# Patient Record
Sex: Female | Born: 1995 | Race: White | Hispanic: No | Marital: Single | Smoking: Former smoker
Health system: Southern US, Community
[De-identification: ages and names within clinical notes are randomized; demographics above are authoritative.]

## PROBLEM LIST (undated history)

## (undated) DIAGNOSIS — R55 Syncope and collapse: Secondary | ICD-10-CM

## (undated) DIAGNOSIS — IMO0002 Reserved for concepts with insufficient information to code with codable children: Secondary | ICD-10-CM

## (undated) DIAGNOSIS — M329 Systemic lupus erythematosus, unspecified: Secondary | ICD-10-CM

## (undated) DIAGNOSIS — G8929 Other chronic pain: Secondary | ICD-10-CM

## (undated) DIAGNOSIS — G90A Postural orthostatic tachycardia syndrome (POTS): Secondary | ICD-10-CM

## (undated) DIAGNOSIS — M542 Cervicalgia: Secondary | ICD-10-CM

## (undated) DIAGNOSIS — R51 Headache: Secondary | ICD-10-CM

## (undated) DIAGNOSIS — R519 Headache, unspecified: Secondary | ICD-10-CM

## (undated) HISTORY — DX: Headache: R51

## (undated) HISTORY — DX: Headache, unspecified: R51.9

## (undated) HISTORY — PX: TONSILLECTOMY: SUR1361

## (undated) HISTORY — DX: Other chronic pain: G89.29

---
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
## 2014-01-04 ENCOUNTER — Ambulatory Visit (INDEPENDENT_AMBULATORY_CARE_PROVIDER_SITE_OTHER): Payer: Medicaid Other | Admitting: Family Medicine

## 2014-01-04 ENCOUNTER — Encounter: Payer: Self-pay | Admitting: Family Medicine

## 2014-01-04 VITALS — BP 112/69 | HR 89 | Temp 97.2°F | Ht 62.5 in | Wt 202.0 lb

## 2014-01-04 DIAGNOSIS — F329 Major depressive disorder, single episode, unspecified: Secondary | ICD-10-CM

## 2014-01-04 DIAGNOSIS — F32A Depression, unspecified: Secondary | ICD-10-CM

## 2014-01-04 DIAGNOSIS — J069 Acute upper respiratory infection, unspecified: Secondary | ICD-10-CM

## 2014-01-04 DIAGNOSIS — Z309 Encounter for contraceptive management, unspecified: Secondary | ICD-10-CM

## 2014-01-04 DIAGNOSIS — F3289 Other specified depressive episodes: Secondary | ICD-10-CM

## 2014-01-04 MED ORDER — AZITHROMYCIN 250 MG PO TABS: ORAL_TABLET | ORAL | Status: DC

## 2014-01-04 MED ORDER — BUPROPION HCL ER (XL) 150 MG PO TB24: 150.0000 mg | ORAL_TABLET | Freq: Every day | ORAL | Status: DC

## 2014-01-04 NOTE — Progress Notes (Signed)
   Subjective:    Patient ID: Shannon Galloway, female    DOB: 07/31/1996, 18 y.o.   MRN: [id number]  HPI This 18 y.o. female presents for evaluation of uri sx's and wants to discuss Contraception. She has a transdermal nexplanon insertion which she is not happy with because of her Weight gain.  She has been having problems with anger, depression, and panic sx's and she Was started on celexa and has been feeling better but she has put on weight and she is tired in The am.  She feels less motivated and tired since starting celexa.  She is c/o uri sx's.   Review of Systems C/o fatigue, weight gain, and URI sx's. No chest pain, SOB, HA, dizziness, vision change, N/V, diarrhea, constipation, dysuria, urinary urgency or frequency, myalgias, arthralgias or rash.     Objective:   Physical Exam  Vital signs noted  Well developed well nourished female.  HEENT - Head atraumatic Normocephalic                Eyes - PERRLA, Conjuctiva - clear Sclera- Clear EOMI                Ears - EAC's Wnl TM's Wnl Gross Hearing WNL                Nose - Nares patent                 Throat - oropharanx wnl Respiratory - Lungs CTA bilateral Cardiac - RRR S1 and S2 without murmur GI - Abdomen soft Nontender and bowel sounds active x 4 Extremities - No edema. Neuro - Grossly intact.      Assessment & Plan:  Contraception management - Plan: Ambulatory referral to Obstetrics / GYN.  She request to have implant for contraception removed and wants to go on BCP's.  Depression - Plan: buPROPion (WELLBUTRIN XL) 150 MG 24 hr tablet  DC celexa  And follow up in one month or sooner if having any se's.  She is provided a note for school Stating that rx medicine has made her tired etc.   URI (upper respiratory infection) - Plan: azithromycin (ZITHROMAX) 250 MG tablet. Push po fluids, rest, tylenol and motrin otc prn as directed for fever, arthralgias, and myalgias.  Follow up prn if sx's continue or  persist.  Deatra CanterWilliam J Deann Mclaine FNP

## 2014-01-04 NOTE — Patient Instructions (Signed)

## 2014-01-12 ENCOUNTER — Ambulatory Visit (INDEPENDENT_AMBULATORY_CARE_PROVIDER_SITE_OTHER): Payer: Medicaid Other | Admitting: Obstetrics & Gynecology

## 2014-01-12 ENCOUNTER — Encounter: Payer: Self-pay | Admitting: Obstetrics & Gynecology

## 2014-01-12 VITALS — BP 108/70 | HR 90 | Temp 98.1°F | Resp 20 | Wt 205.0 lb

## 2014-01-12 DIAGNOSIS — Z309 Encounter for contraceptive management, unspecified: Secondary | ICD-10-CM

## 2014-01-12 DIAGNOSIS — Z3009 Encounter for other general counseling and advice on contraception: Secondary | ICD-10-CM

## 2014-01-12 DIAGNOSIS — Z3046 Encounter for surveillance of implantable subdermal contraceptive: Secondary | ICD-10-CM

## 2014-01-12 MED ORDER — NORGESTIM-ETH ESTRAD TRIPHASIC 0.18/0.215/0.25 MG-25 MCG PO TABS
1.0000 | ORAL_TABLET | Freq: Every day | ORAL | Status: DC
Start: 2014-01-12 — End: 2014-03-08

## 2014-01-12 NOTE — Progress Notes (Signed)
Patient ID: Shannon Galloway, female   DOB: 02/03/1996, 18 y.o.   MRN: [id number] Requests removal of Nexplanon due to weight gain, will switch to OCP.  Consent obtained, 1% lidocaine 3 ml at site on left arm , betadine prp, small incision with 11 blade and Nexplanon removed intact and dressing applied. Rx Ortho Tricyclen lo   RTC PRN  Adam PhenixJames G [name], MD 01/12/2014

## 2014-01-12 NOTE — Progress Notes (Signed)
Pt desires removal of Nexplanon as she states she has gained 40 lbs since it was inserted in May 2014.

## 2014-01-12 NOTE — Patient Instructions (Signed)
Oral Contraception Information  Oral contraceptive pills (OCPs) are medicines taken to prevent pregnancy. OCPs work by preventing the ovaries from releasing eggs. The hormones in OCPs also cause the cervical mucus to thicken, preventing the sperm from entering the uterus. The hormones also cause the uterine lining to become thin, not allowing a fertilized egg to attach to the inside of the uterus. OCPs are highly effective when taken exactly as prescribed. However, OCPs do not prevent sexually transmitted diseases (STDs). Safe sex practices, such as using condoms along with the pill, can help prevent STDs.   Before taking the pill, you may have a physical exam and Pap test. Your health care provider may order blood tests. The health care provider will make sure you are a good candidate for oral contraception. Discuss with your health care provider the possible side effects of the OCP you may be prescribed. When starting an OCP, it can take 2 to 3 months for the body to adjust to the changes in hormone levels in your body.   TYPES OF ORAL CONTRACEPTION  · The combination pill This pill contains estrogen and progestin (synthetic progesterone) hormones. The combination pill comes in 21-day, 28-day, or 91-day packs. Some types of combination pills are meant to be taken continuously (365-day pills). With 21-day packs, you do not take pills for 7 days after the last pill. With 28-day packs, the pill is taken every day. The last 7 pills are without hormones. Certain types of pills have more than 21 hormone-containing pills. With 91-day packs, the first 84 pills contain both hormones, and the last 7 pills contain no hormones or contain estrogen only.  · The minipill This pill contains the progesterone hormone only. The pill is taken every day continuously. It is very important to take the pill at the same time each day. The minipill comes in packs of 28 pills. All 28 pills contain the hormone.    ADVANTAGES OF ORAL  CONTRACEPTIVE PILLS  · Decreases premenstrual symptoms.    · Treats menstrual period cramps.    · Regulates the menstrual cycle.    · Decreases a heavy menstrual flow.    · May treat acne, depending on the type of pill.    · Treats abnormal uterine bleeding.    · Treats polycystic ovarian syndrome.    · Treats endometriosis.    · Can be used as emergency contraception.    THINGS THAT CAN MAKE ORAL CONTRACEPTIVE PILLS LESS EFFECTIVE  OCPs can be less effective if:   · You forget to take the pill at the same time every day.    · You have a stomach or intestinal disease that lessens the absorption of the pill.    · You take OCPs with other medicines that make OCPs less effective, such as antibiotics, certain HIV medicines, and some seizure medicines.    · You take expired OCPs.    · You forget to restart the pill on day 7, when using the packs of 21 pills.    RISKS ASSOCIATED WITH ORAL CONTRACEPTIVE PILLS   Oral contraceptive pills can sometimes cause side effects, such as:  · Headache.  · Nausea.  · Breast tenderness.  · Irregular bleeding or spotting.  Combination pills are also associated with a small increased risk of:  · Blood clots.  · Heart attack.  · Stroke.  Document Released: 03/09/2003 Document Revised: 10/07/2013 Document Reviewed: 06/07/2013  [employer]® Patient Information ©2014 [employer].

## 2014-01-30 ENCOUNTER — Emergency Department (HOSPITAL_COMMUNITY)
Admission: EM | Admit: 2014-01-30 | Discharge: 2014-01-31 | Disposition: A | Discharge status: Home / Self Care | Payer: Medicaid Other | Attending: Emergency Medicine | Admitting: Emergency Medicine

## 2014-01-30 ENCOUNTER — Encounter (HOSPITAL_COMMUNITY): Payer: Self-pay | Admitting: Emergency Medicine

## 2014-01-30 DIAGNOSIS — R05 Cough: Secondary | ICD-10-CM | POA: Insufficient documentation

## 2014-01-30 DIAGNOSIS — IMO0001 Reserved for inherently not codable concepts without codable children: Secondary | ICD-10-CM | POA: Insufficient documentation

## 2014-01-30 DIAGNOSIS — R109 Unspecified abdominal pain: Secondary | ICD-10-CM | POA: Insufficient documentation

## 2014-01-30 DIAGNOSIS — Z79899 Other long term (current) drug therapy: Secondary | ICD-10-CM | POA: Insufficient documentation

## 2014-01-30 DIAGNOSIS — R059 Cough, unspecified: Secondary | ICD-10-CM | POA: Insufficient documentation

## 2014-01-30 DIAGNOSIS — Z3202 Encounter for pregnancy test, result negative: Secondary | ICD-10-CM | POA: Insufficient documentation

## 2014-01-30 DIAGNOSIS — M791 Myalgia, unspecified site: Secondary | ICD-10-CM

## 2014-01-30 MED ORDER — IBUPROFEN 800 MG PO TABS
800.0000 mg | ORAL_TABLET | Freq: Once | ORAL | Status: AC
  Administered 2014-01-31: 800 mg via ORAL
  Filled 2014-01-30: qty 1

## 2014-01-30 NOTE — ED Provider Notes (Signed)
CSN: [id number]     Arrival date & time 01/30/14  2250 History  This chart was scribed for Joya Gaskinsonald W Lanora Reveron, MD by Joaquin MusicKristina Sanchez-Matthews, ED Scribe. This patient was seen in room APA07/APA07 and the patient's care was started at  11:41 PM  Chief Complaint  Patient presents with  . Generalized Body Aches   The history is provided by the patient. No language interpreter was used.  HPI Comments: Shannon Galloway is a 18 y.o. female who presents to the Emergency Department complaining of ongoing generalized body aches with bilateral upper extremity weakness, intermittent cough, abd and R flank pain that began this morning. Pt states she woke up this morning feeling "like she was beat up by 2 people". Pt states she has been having darker stools than normal but denies hematochezia. Pt denies taking OTC medications. Pt denies hx of generalized body aches. Pt denies hx of DM and states she is otherwise healthy. Pt denies fever, chills, n/v/d/ sore throat, dysuria, frequency, hematochezia, and vaginal bleeding or discharge.   Pt states her PCP plans to screen her thyroid due to having a hx of anxiety and facial numbness associated. Pt states she has gained over 60+ lbs since May 2014. She states she has "bad lower extremity swelling but believes this is due to working long hours at her [employer].   Past Medical History  Diagnosis Date  . Chronic headaches   . Neck pain    Past Surgical History  Procedure Laterality Date  . Knee surgery Right 2011  . Tonsillectomy Bilateral    Family History  Problem Relation Age of Onset  . Adopted: Yes  . Heart failure Father     Died at 6041  . Bipolar disorder Mother   . Pneumonia Mother     Died at 2739   History  Substance Use Topics  . Smoking status: Passive Smoke Exposure - Never Smoker  . Smokeless tobacco: Never Used  . Alcohol Use: No   OB History   Grav Para Term Preterm Abortions TAB SAB Ect Mult Living   0 0 0 0 0 0 0 0 0 0       Review of Systems  Constitutional: Negative for fever.  HENT: Negative for sore throat.   Respiratory: Positive for cough.   Gastrointestinal: Negative for nausea, vomiting, diarrhea and blood in stool.  Genitourinary: Negative for dysuria, urgency and vaginal bleeding.  Neurological: Positive for weakness (upper extremities).  All other systems reviewed and are negative.    Allergies  Review of patient's allergies indicates no known allergies.  Home Medications   Current Outpatient Rx  Name  Route  Sig  Dispense  Refill  . azithromycin (ZITHROMAX) 250 MG tablet      Take 2 po today and one po qd x 4 days   6 tablet   1   . buPROPion (WELLBUTRIN XL) 150 MG 24 hr tablet   Oral   Take 1 tablet (150 mg total) by mouth daily.   30 tablet   11   . etonogestrel (IMPLANON) 68 MG IMPL implant   Subcutaneous   Inject 1 each into the skin once.         . Norgestimate-Ethinyl Estradiol Triphasic (ORTHO TRI-CYCLEN LO) 0.18/0.215/0.25 MG-25 MCG tab   Oral   Take 1 tablet by mouth daily.   1 Package   11    Triage Vitals:BP 140/70  Pulse 98  Temp(Src) 98.9 F (37.2 C) (Oral)  Resp  18  Ht 5\' 2"  (1.575 m)  Wt 195 lb (88.451 kg)  BMI 35.66 kg/m2  SpO2 100%  LMP 12/28/2013  Physical Exam CONSTITUTIONAL: Well developed/well nourished, no distress HEAD: Normocephalic/atraumatic EYES: EOMI/PERRL ENMT: Mucous membranes moist NECK: supple no meningeal signs. No thyromegaly. SPINE:entire spine nontender CV: S1/S2 noted, no murmurs/rubs/gallops noted LUNGS: Lungs are clear to auscultation bilaterally, no apparent distress ABDOMEN: soft, nontender, no rebound or guarding GU:no cva tenderness NEURO: Pt is awake/alert, moves all extremitiesx4, gait normal without ataxia.  No arm drift.  No focal weakness noted to any of her extremities.   EXTREMITIES: pulses normal, full ROM. No edema or discoloration to lower extremities. SKIN: warm, color normal PSYCH: no  abnormalities of mood noted  ED Course  Procedures  DIAGNOSTIC STUDIES: Oxygen Saturation is 100% on RA, normal by my interpretation.    COORDINATION OF CARE: 11:45 PM-Discussed treatment plan which includes labs. Pt agreed to plan.   Pt here for multiple complaints She mentions abd pain but no focal tenderness noted She reported weakness but no focal weakness noted in any of her extremities She also reports myalgias, but her exam is unremarkable Advised need for PCP followup Advised need for recheck of her BP by PCP  Labs Review Labs Reviewed - No data to display Imaging Review No results found.  EKG Interpretation   None       MDM  No diagnosis found. Nursing notes including past medical history and social history reviewed and considered in documentation Labs/vital reviewed and considered   I personally performed the services described in this documentation, which was scribed in my presence. The recorded information has been reviewed and is accurate.      Joya Gaskins, MD 01/31/14 [phone]

## 2014-01-30 NOTE — ED Notes (Signed)
Pt reporting vague generalized body aches.  States pain is mostly in neck and knees.  Denies nausea or vomiting.  Reporting that she had dark stools about 2 weeks ago.  Pt also reporting that her doctor wanted to have her thyroid checked several months ago and would like to have that test completed.

## 2014-01-31 LAB — URINALYSIS, ROUTINE W REFLEX MICROSCOPIC
Bilirubin Urine: NEGATIVE
GLUCOSE, UA: NEGATIVE mg/dL
HGB URINE DIPSTICK: NEGATIVE
Ketones, ur: NEGATIVE mg/dL
LEUKOCYTES UA: NEGATIVE
Nitrite: NEGATIVE
PH: 6.5 (ref 5.0–8.0)
PROTEIN: NEGATIVE mg/dL
Specific Gravity, Urine: 1.02 (ref 1.005–1.030)
Urobilinogen, UA: 0.2 mg/dL (ref 0.0–1.0)

## 2014-02-01 LAB — POCT PREGNANCY, URINE
Preg Test, Ur: NEGATIVE
Preg Test, Ur: NEGATIVE

## 2014-03-08 ENCOUNTER — Encounter (HOSPITAL_COMMUNITY): Payer: Self-pay | Admitting: Emergency Medicine

## 2014-03-08 ENCOUNTER — Emergency Department (HOSPITAL_COMMUNITY)
Admission: EM | Admit: 2014-03-08 | Discharge: 2014-03-08 | Disposition: A | Discharge status: Home / Self Care | Payer: Medicaid Other | Attending: Emergency Medicine | Admitting: Emergency Medicine

## 2014-03-08 DIAGNOSIS — M25476 Effusion, unspecified foot: Secondary | ICD-10-CM | POA: Insufficient documentation

## 2014-03-08 DIAGNOSIS — Z9889 Other specified postprocedural states: Secondary | ICD-10-CM | POA: Insufficient documentation

## 2014-03-08 DIAGNOSIS — G8929 Other chronic pain: Secondary | ICD-10-CM | POA: Insufficient documentation

## 2014-03-08 DIAGNOSIS — R635 Abnormal weight gain: Secondary | ICD-10-CM | POA: Insufficient documentation

## 2014-03-08 DIAGNOSIS — R000 Tachycardia, unspecified: Secondary | ICD-10-CM | POA: Insufficient documentation

## 2014-03-08 DIAGNOSIS — R609 Edema, unspecified: Secondary | ICD-10-CM | POA: Insufficient documentation

## 2014-03-08 DIAGNOSIS — M25473 Effusion, unspecified ankle: Secondary | ICD-10-CM | POA: Insufficient documentation

## 2014-03-08 DIAGNOSIS — Z3202 Encounter for pregnancy test, result negative: Secondary | ICD-10-CM | POA: Insufficient documentation

## 2014-03-08 LAB — CBC WITH DIFFERENTIAL/PLATELET
Basophils Absolute: 0 10*3/uL (ref 0.0–0.1)
Basophils Relative: 0 % (ref 0–1)
Eosinophils Absolute: 0.1 10*3/uL (ref 0.0–0.7)
Eosinophils Relative: 1 % (ref 0–5)
HCT: 34.4 % — ABNORMAL LOW (ref 36.0–46.0)
Hemoglobin: 11.8 g/dL — ABNORMAL LOW (ref 12.0–15.0)
Lymphocytes Relative: 24 % (ref 12–46)
Lymphs Abs: 2.3 10*3/uL (ref 0.7–4.0)
MCH: 28.9 pg (ref 26.0–34.0)
MCHC: 34.3 g/dL (ref 30.0–36.0)
MCV: 84.3 fL (ref 78.0–100.0)
Monocytes Absolute: 0.5 10*3/uL (ref 0.1–1.0)
Monocytes Relative: 6 % (ref 3–12)
Neutro Abs: 6.4 10*3/uL (ref 1.7–7.7)
Neutrophils Relative %: 68 % (ref 43–77)
Platelets: 276 10*3/uL (ref 150–400)
RBC: 4.08 MIL/uL (ref 3.87–5.11)
RDW: 13.1 % (ref 11.5–15.5)
WBC: 9.3 10*3/uL (ref 4.0–10.5)

## 2014-03-08 LAB — URINALYSIS, ROUTINE W REFLEX MICROSCOPIC
Bilirubin Urine: NEGATIVE
Glucose, UA: NEGATIVE mg/dL
Hgb urine dipstick: NEGATIVE
Ketones, ur: NEGATIVE mg/dL
Leukocytes, UA: NEGATIVE
Nitrite: NEGATIVE
Protein, ur: NEGATIVE mg/dL
Specific Gravity, Urine: 1.015 (ref 1.005–1.030)
Urobilinogen, UA: 0.2 mg/dL (ref 0.0–1.0)
pH: 7 (ref 5.0–8.0)

## 2014-03-08 LAB — COMPREHENSIVE METABOLIC PANEL
ALT: 32 U/L (ref 0–35)
AST: 20 U/L (ref 0–37)
Albumin: 3.7 g/dL (ref 3.5–5.2)
Alkaline Phosphatase: 59 U/L (ref 39–117)
BUN: 10 mg/dL (ref 6–23)
CO2: 25 mEq/L (ref 19–32)
Calcium: 8.8 mg/dL (ref 8.4–10.5)
Chloride: 106 mEq/L (ref 96–112)
Creatinine, Ser: 0.59 mg/dL (ref 0.50–1.10)
GFR calc Af Amer: >90 mL/min (ref >90)
GFR calc non Af Amer: >90 mL/min (ref >90)
Glucose, Bld: 93 mg/dL (ref 70–99)
Potassium: 3.6 mEq/L — ABNORMAL LOW (ref 3.7–5.3)
Sodium: 141 mEq/L (ref 137–147)
Total Bilirubin: <0.2 mg/dL — ABNORMAL LOW (ref 0.3–1.2)
Total Protein: 7.6 g/dL (ref 6.0–8.3)

## 2014-03-08 LAB — PREGNANCY, URINE: Preg Test, Ur: NEGATIVE

## 2014-03-08 MED ORDER — FUROSEMIDE 20 MG PO TABS: 20.0000 mg | ORAL_TABLET | Freq: Two times a day (BID) | ORAL | Status: DC

## 2014-03-08 MED ORDER — FUROSEMIDE 40 MG PO TABS
20.0000 mg | ORAL_TABLET | Freq: Once | ORAL | Status: AC
  Administered 2014-03-08: 20 mg via ORAL
  Filled 2014-03-08: qty 1

## 2014-03-08 MED ORDER — POTASSIUM CHLORIDE ER 10 MEQ PO TBCR: 10.0000 meq | EXTENDED_RELEASE_TABLET | Freq: Two times a day (BID) | ORAL | Status: DC

## 2014-03-08 MED ORDER — POTASSIUM CHLORIDE CRYS ER 20 MEQ PO TBCR
40.0000 meq | EXTENDED_RELEASE_TABLET | Freq: Once | ORAL | Status: AC
Start: 2014-03-08 — End: 2014-03-08
  Administered 2014-03-08: 40 meq via ORAL
  Filled 2014-03-08: qty 2

## 2014-03-08 NOTE — ED Provider Notes (Signed)
CSN: [id number]     Arrival date & time 03/08/14  1545 History   This chart was scribed for Raeford Razor, MD by Ladona Ridgel Day, ED scribe. This patient was seen in room APA12/APA12 and the patient's care was started at 1545.  Chief Complaint  Patient presents with  . Shortness of Breath   The history is provided by the patient. No language interpreter was used.   HPI Comments: Shannon Galloway is a 18 y.o. female who presents to the Emergency Department complaining of waxing and waning bilateral foot and ankle swelling, ongoing for x1 month and was directed to come to ED for this problem after she was seen by her PCP today and would like her to have blood work as well thyroid level. She reports swelling not as bad now as it has been, at one point "it was so bad the skin over my feet turned purple" and statse taking a hot shower makes swelling worse. She is also concerned for family hx of DM b/c she has been excessively thirsty and voiding frequently. She also describes periodic swelling of her hands but not as bad as swelling in her feet/ankles. She also reports that has unintentionally gained 70 lb over the past year. She reports SOB that worsens w/exertion, does not worsen w/laying flat while sleeping. She denies abdominal pain or diarrhea.   Past Medical History  Diagnosis Date  . Chronic headaches   . Neck pain    Past Surgical History  Procedure Laterality Date  . Knee surgery Right 2011  . Tonsillectomy Bilateral    Family History  Problem Relation Age of Onset  . Adopted: Yes  . Heart failure Father     Died at 55  . Bipolar disorder Mother   . Pneumonia Mother     Died at 57   History  Substance Use Topics  . Smoking status: Passive Smoke Exposure - Never Smoker  . Smokeless tobacco: Never Used  . Alcohol Use: No   OB History   Grav Para Term Preterm Abortions TAB SAB Ect Mult Living   0 0 0 0 0 0 0 0 0 0      Review of Systems  Constitutional: Negative for fever and  chills.  Respiratory: Positive for shortness of breath. Negative for cough.   Cardiovascular: Positive for leg swelling. Negative for chest pain.  Gastrointestinal: Negative for nausea, vomiting, abdominal pain and diarrhea.  Musculoskeletal: Negative for back pain.  All other systems reviewed and are negative.   Allergies  Review of patient's allergies indicates no known allergies.  Home Medications   Current Outpatient Rx  Name  Route  Sig  Dispense  Refill  . naproxen sodium (ANAPROX) 550 MG tablet   Oral   Take 550 mg by mouth daily as needed for mild pain or moderate pain.          Triage Vitals: BP 142/66  Pulse 107  Temp(Src) 98 F (36.7 C) (Oral)  Resp 18  Ht 5\' 2"  (1.575 m)  Wt 224 lb 9 oz (101.861 kg)  BMI 41.06 kg/m2  SpO2 98%  LMP 01/08/2014  Physical Exam  Nursing note and vitals reviewed. Constitutional: She is oriented to person, place, and time. She appears well-developed and well-nourished. No distress.  HENT:  Head: Normocephalic and atraumatic.  Eyes: Conjunctivae are normal. Right eye exhibits no discharge. Left eye exhibits no discharge.  Neck: Normal range of motion. Neck supple.  Cardiovascular: Normal rate, regular rhythm and normal  heart sounds.  Exam reveals no gallop and no friction rub.   No murmur heard. Mildly tachycardic  Pulmonary/Chest: Effort normal and breath sounds normal. No respiratory distress. She has no wheezes. She has no rales.  Abdominal: Soft. She exhibits no distension. There is no tenderness.  Musculoskeletal: Normal range of motion. She exhibits edema. She exhibits no tenderness.  Severe, symmetric, non pitting lower edema Negative homan's, no calf tenderness  Neurological: She is alert and oriented to person, place, and time.  Skin: Skin is warm and dry.  Psychiatric: She has a normal mood and affect. Her behavior is normal. Thought content normal.   ED Course  Procedures (including critical care time) DIAGNOSTIC  STUDIES: Oxygen Saturation is 98% on room air, normal by my interpretation.    COORDINATION OF CARE: At 415 PM Discussed treatment plan with patient which includes TSH, T4 free, blood work, UA, pregnancy UA, EKG. Patient agrees.   Labs Review Labs Reviewed  COMPREHENSIVE METABOLIC PANEL - Abnormal; Notable for the following:    Potassium 3.6 (*)    Total Bilirubin <0.2 (*)    All other components within normal limits  CBC WITH DIFFERENTIAL - Abnormal; Notable for the following:    Hemoglobin 11.8 (*)    HCT 34.4 (*)    All other components within normal limits  URINALYSIS, ROUTINE W REFLEX MICROSCOPIC - Abnormal; Notable for the following:    APPearance HAZY (*)    All other components within normal limits  PREGNANCY, URINE  TSH  T4, FREE   Imaging Review No results found.   EKG Interpretation   Date/Time:  Monday March 08 2014 16:31:33 EDT Ventricular Rate:  99 PR Interval:  120 QRS Duration: 98 QT Interval:  348 QTC Calculation: 446 R Axis:   30 Text Interpretation:  Normal sinus rhythm Normal ECG No previous ECGs  available Confirmed by Dannilynn Gallina  MD, Ellaina Schuler ([id number]) on 03/08/2014 6:13:13 PM      MDM   Final diagnoses:  Peripheral edema    17yF with peripheral edema. Per review of records, ~60 lbs weight gain in less than a year. W/u pretty unremarkable. No increased WOB, hypoxemia or adventitious breath sounds. Renal function normal. LFTs normal. Mild tachycardia but noted on numerous previous visits as well. Pt understands thyroid studies need followed-up. Short course lasix. Return precautions discussed.    I personally performed the services described in this documentation, which was scribed in my presence. The recorded information has been reviewed and is accurate.        Raeford RazorStephen Rafeef Lau, MD 03/08/14 1816

## 2014-03-08 NOTE — ED Notes (Signed)
Patient felt light headed after walking to the restroom and back to the room. Checked vital signs and they were within normal limits. RN made aware.

## 2014-03-08 NOTE — ED Notes (Addendum)
Pt states SOB and swelling to feet x 1 mo. Pt states she keeps gaining weight. [facility] Pt went by office but was unable to be seen today. Pt [facility].

## 2014-03-08 NOTE — Discharge Instructions (Signed)

## 2014-03-09 ENCOUNTER — Telehealth: Payer: Self-pay | Admitting: Family Medicine

## 2014-03-09 ENCOUNTER — Ambulatory Visit: Payer: Medicaid Other | Admitting: Family Medicine

## 2014-03-09 LAB — TSH: TSH: 3.39 u[IU]/mL (ref 0.350–4.500)

## 2014-03-09 LAB — T4, FREE: Free T4: 0.86 ng/dL (ref 0.80–1.80)

## 2014-03-10 NOTE — Telephone Encounter (Signed)
appt made

## 2014-03-11 ENCOUNTER — Encounter: Payer: Self-pay | Admitting: *Deleted

## 2014-03-11 ENCOUNTER — Ambulatory Visit (INDEPENDENT_AMBULATORY_CARE_PROVIDER_SITE_OTHER): Payer: Medicaid Other

## 2014-03-11 ENCOUNTER — Ambulatory Visit (INDEPENDENT_AMBULATORY_CARE_PROVIDER_SITE_OTHER): Payer: Medicaid Other | Admitting: Nurse Practitioner

## 2014-03-11 VITALS — BP 130/75 | HR 103 | Temp 99.1°F | Ht 62.0 in | Wt 218.4 lb

## 2014-03-11 DIAGNOSIS — M545 Low back pain, unspecified: Secondary | ICD-10-CM

## 2014-03-11 DIAGNOSIS — N912 Amenorrhea, unspecified: Secondary | ICD-10-CM

## 2014-03-11 DIAGNOSIS — R55 Syncope and collapse: Secondary | ICD-10-CM

## 2014-03-11 LAB — POCT URINE PREGNANCY: Preg Test, Ur: NEGATIVE

## 2014-03-11 LAB — POCT URINALYSIS DIPSTICK
BILIRUBIN UA: NEGATIVE
GLUCOSE UA: NEGATIVE
Ketones, UA: NEGATIVE
Leukocytes, UA: NEGATIVE
Nitrite, UA: NEGATIVE
Protein, UA: NEGATIVE
RBC UA: NEGATIVE
UROBILINOGEN UA: NEGATIVE
pH, UA: 5

## 2014-03-11 LAB — POCT UA - MICROSCOPIC ONLY
Bacteria, U Microscopic: NEGATIVE
CASTS, UR, LPF, POC: NEGATIVE
Crystals, Ur, HPF, POC: NEGATIVE
MUCUS UA: NEGATIVE
RBC, urine, microscopic: NEGATIVE
WBC, Ur, HPF, POC: NEGATIVE

## 2014-03-11 MED ORDER — CYCLOBENZAPRINE HCL 5 MG PO TABS: 5.0000 mg | ORAL_TABLET | Freq: Three times a day (TID) | ORAL | Status: DC | PRN

## 2014-03-11 MED ORDER — IBUPROFEN 800 MG PO TABS: 800.0000 mg | ORAL_TABLET | Freq: Three times a day (TID) | ORAL | Status: DC | PRN

## 2014-03-11 NOTE — Progress Notes (Signed)
   Subjective:    Patient ID: Shannon Galloway, female    DOB: 11/17/1996, 18 y.o.   MRN: [id number]  HPI  Patient in today with 2 compliants: 1- Passed out last night- she was yelling at her dogs standing and the next thing she knew she was on the floor- Was by herself so has no idea how long she was out- Ems came out and they said she needed to be checked out. Has headache but says that she always has headaches. Denies hitting her head when she fell. 2- she is also c/o lower back pain- started 1hour ago - was sitting in car when it started. Pain is in the middle of her back .sitting straight in a chair relieves some pain.   Review of Systems  Constitutional: Negative.   Eyes: Negative.   Respiratory: Negative.   Cardiovascular: Negative.   Genitourinary: Negative.   Neurological: Positive for syncope and headaches. Negative for dizziness, seizures, light-headedness and numbness.  All other systems reviewed and are negative.       Objective:   Physical Exam  Constitutional: She is oriented to person, place, and time. She appears well-developed and well-nourished.  Cardiovascular: Normal rate, regular rhythm and normal heart sounds.   Pulmonary/Chest: Effort normal and breath sounds normal.  Musculoskeletal:  FROM of lumbar spine with pain on flexion and rotation to left (-) SLR bil Motor strength and sensation distally intact  Neurological: She is alert and oriented to person, place, and time. She has normal reflexes. No cranial nerve deficit.  Skin: Skin is warm and dry.  Psychiatric: She has a normal mood and affect. Her behavior is normal. Judgment and thought content normal.   BP 130/75  Pulse 103  Temp(Src) 99.1 F (37.3 C) (Oral)  Ht 5\' 2"  (1.575 m)  Wt 218 lb 6.4 oz (99.066 kg)  BMI 39.94 kg/m2  LMP 12/11/2013        Assessment & Plan:   1. Low back pain   2. Amenorrhea   3. Syncope, unspecified    Meds ordered this encounter  Medications  . ibuprofen  (ADVIL,MOTRIN) 800 MG tablet    Sig: Take 1 tablet (800 mg total) by mouth every 8 (eight) hours as needed.    Dispense:  40 tablet    Refill:  0    Order Specific Question:  Supervising Provider    Answer:  Ernestina PennaMOORE, DONALD W [[id number]]  . cyclobenzaprine (FLEXERIL) 5 MG tablet    Sig: Take 1 tablet (5 mg total) by mouth 3 (three) times daily as needed for muscle spasms.    Dispense:  30 tablet    Refill:  0    Order Specific Question:  Supervising Provider    Answer:  Ernestina PennaMOORE, DONALD W [[id number]]   Sedation precautions with muscle relaxor Force fluids  Rest No heavy lifting Back strengthening exercises rto prn referall to gyn for amenorrhea  Shannon Daphine DeutscherMartin, FNP

## 2014-03-11 NOTE — Patient Instructions (Signed)
Back Exercises Back exercises help treat and prevent back injuries. The goal of back exercises is to increase the strength of your abdominal and back muscles and the flexibility of your back. These exercises should be started when you no longer have back pain. Back exercises include:  Pelvic Tilt. Lie on your back with your knees bent. Tilt your pelvis until the lower part of your back is against the floor. Hold this position 5 to 10 sec and repeat 5 to 10 times.  Knee to Chest. Pull first 1 knee up against your chest and hold for 20 to 30 seconds, repeat this with the other knee, and then both knees. This may be done with the other leg straight or bent, whichever feels better.  Sit-Ups or Curl-Ups. Bend your knees 90 degrees. Start with tilting your pelvis, and do a partial, slow sit-up, lifting your trunk only 30 to 45 degrees off the floor. Take at least 2 to 3 seconds for each sit-up. Do not do sit-ups with your knees out straight. If partial sit-ups are difficult, simply do the above but with only tightening your abdominal muscles and holding it as directed.  Hip-Lift. Lie on your back with your knees flexed 90 degrees. Push down with your feet and shoulders as you raise your hips a couple inches off the floor; hold for 10 seconds, repeat 5 to 10 times.  Back arches. Lie on your stomach, propping yourself up on bent elbows. Slowly press on your hands, causing an arch in your low back. Repeat 3 to 5 times. Any initial stiffness and discomfort should lessen with repetition over time.  Shoulder-Lifts. Lie face down with arms beside your body. Keep hips and torso pressed to floor as you slowly lift your head and shoulders off the floor. Do not overdo your exercises, especially in the beginning. Exercises may cause you some mild back discomfort which lasts for a few minutes; however, if the pain is more severe, or lasts for more than 15 minutes, do not continue exercises until you see your caregiver.  Improvement with exercise therapy for back problems is slow.  See your caregivers for assistance with developing a proper back exercise program. Document Released: 01/24/2005 Document Revised: 03/10/2012 Document Reviewed: 10/18/2011 [employer] Patient Information 2014 [employer].  

## 2014-03-15 ENCOUNTER — Ambulatory Visit: Payer: Medicaid Other | Admitting: Family Medicine

## 2015-05-24 ENCOUNTER — Emergency Department (HOSPITAL_COMMUNITY): Payer: Self-pay

## 2015-05-24 ENCOUNTER — Emergency Department (HOSPITAL_COMMUNITY)
Admission: EM | Admit: 2015-05-24 | Discharge: 2015-05-24 | Disposition: A | Discharge status: Home / Self Care | Payer: Self-pay | Attending: Emergency Medicine | Admitting: Emergency Medicine

## 2015-05-24 ENCOUNTER — Encounter (HOSPITAL_COMMUNITY): Payer: Self-pay | Admitting: Cardiology

## 2015-05-24 DIAGNOSIS — Z79899 Other long term (current) drug therapy: Secondary | ICD-10-CM | POA: Insufficient documentation

## 2015-05-24 DIAGNOSIS — S91119A Laceration without foreign body of unspecified toe without damage to nail, initial encounter: Secondary | ICD-10-CM

## 2015-05-24 DIAGNOSIS — S91112A Laceration without foreign body of left great toe without damage to nail, initial encounter: Secondary | ICD-10-CM | POA: Insufficient documentation

## 2015-05-24 DIAGNOSIS — W208XXA Other cause of strike by thrown, projected or falling object, initial encounter: Secondary | ICD-10-CM | POA: Insufficient documentation

## 2015-05-24 DIAGNOSIS — Y9389 Activity, other specified: Secondary | ICD-10-CM | POA: Insufficient documentation

## 2015-05-24 DIAGNOSIS — Y998 Other external cause status: Secondary | ICD-10-CM | POA: Insufficient documentation

## 2015-05-24 DIAGNOSIS — S90122A Contusion of left lesser toe(s) without damage to nail, initial encounter: Secondary | ICD-10-CM

## 2015-05-24 DIAGNOSIS — G8929 Other chronic pain: Secondary | ICD-10-CM | POA: Insufficient documentation

## 2015-05-24 DIAGNOSIS — S90112A Contusion of left great toe without damage to nail, initial encounter: Secondary | ICD-10-CM | POA: Insufficient documentation

## 2015-05-24 DIAGNOSIS — Y9289 Other specified places as the place of occurrence of the external cause: Secondary | ICD-10-CM | POA: Insufficient documentation

## 2015-05-24 NOTE — Discharge Instructions (Signed)
Buddy Taping of Toes We have taped your toes together to keep them from moving. This is called "buddy taping" since we used a part of your own body to keep the injured part still. We placed soft padding between your toes to keep them from rubbing against each other. Buddy taping will help with healing and to reduce pain. Keep your toes buddy taped together for as long as directed by your caregiver. HOME CARE INSTRUCTIONS   Raise your injured area above the level of your heart while sitting or lying down. Prop it up with pillows.  An ice pack used every twenty minutes, while awake, for the first one to two days may be helpful. Put ice in a plastic bag and put a towel between the bag and your skin.  Watch for signs that the taping is too tight. These signs may be:  Numbness of your taped toes.  Coolness of your taped toes.  Color change in the area beyond the tape.  Increased pain.  If you have any of these signs, loosen or rewrap the tape. If you need to loosen or rewrap the buddy tape, make sure you use the padding again. SEEK IMMEDIATE MEDICAL CARE IF:   You have worse pain, swelling, inflammation (soreness), drainage or bleeding after you rewrap the tape.  Any new problems occur. MAKE SURE YOU:   Understand these instructions.  Will watch your condition.  Will get help right away if you are not doing well or get worse. Document Released: 09/20/2004 Document Revised: 03/10/2012 Document Reviewed: 12/14/2008 [facility] Patient Information 2015 [employer] [place]. This information is not intended to replace advice given to you by your health care provider. Make sure you discuss any questions you have with your health care provider.  Contusion A contusion is a deep bruise. Contusions happen when an injury causes bleeding under the skin. Signs of bruising include pain, puffiness (swelling), and discolored skin. The contusion may turn blue, purple, or yellow. HOME CARE   Put ice on the  injured area.  Put ice in a plastic bag.  Place a towel between your skin and the bag.  Leave the ice on for 15-20 minutes, 03-04 times a day.  Only take medicine as told by your doctor.  Rest the injured area.  If possible, raise (elevate) the injured area to lessen puffiness. GET HELP RIGHT AWAY IF:   You have more bruising or puffiness.  You have pain that is getting worse.  Your puffiness or pain is not helped by medicine. MAKE SURE YOU:   Understand these instructions.  Will watch your condition.  Will get help right away if you are not doing well or get worse. Document Released: 06/04/2008 Document Revised: 03/10/2012 Document Reviewed: 10/22/2011 [facility] Patient Information 2015 [place] [place]. This information is not intended to replace advice given to you by your health care provider. Make sure you discuss any questions you have with your health care provider.

## 2015-05-24 NOTE — ED Notes (Signed)
Dropped air conditioner unit on left great toe.

## 2015-05-27 NOTE — ED Provider Notes (Signed)
CSN: [id number]     Arrival date & time 05/24/15  1653 History   First MD Initiated Contact with Patient 05/24/15 1728     Chief Complaint  Patient presents with  . Toe Injury     (Consider location/radiation/quality/duration/timing/severity/associated sxs/prior Treatment) HPI   Shannon Galloway is a 19 y.o. female who presents to the Emergency Department complaining of toe pain and laceration.  She states that she accidentally dropped a window air conditioner onto her left big toe.  C/o pain with palpation and weight bearing.  She denies numbness, swelling or pain proximal to the ankle.  TD is UTD.     Past Medical History  Diagnosis Date  . Chronic headaches   . Neck pain    Past Surgical History  Procedure Laterality Date  . Knee surgery Right 2011  . Tonsillectomy Bilateral    Family History  Problem Relation Age of Onset  . Adopted: Yes  . Heart failure Father     Died at 2741  . Bipolar disorder Mother   . Pneumonia Mother     Died at 5139   History  Substance Use Topics  . Smoking status: Passive Smoke Exposure - Never Smoker  . Smokeless tobacco: Never Used  . Alcohol Use: No   OB History    Gravida Para Term Preterm AB TAB SAB Ectopic Multiple Living   0 0 0 0 0 0 0 0 0 0      Review of Systems  Constitutional: Negative for fever and chills.  Musculoskeletal: Positive for arthralgias (left great toe pain). Negative for back pain and joint swelling.  Skin: Positive for wound.       Laceration toe  Neurological: Negative for dizziness, weakness and numbness.  Hematological: Does not bruise/bleed easily.  All other systems reviewed and are negative.     Allergies  Review of patient's allergies indicates no known allergies.  Home Medications   Prior to Admission medications   Medication Sig Start Date End Date Taking? Authorizing Provider  cyclobenzaprine (FLEXERIL) 5 MG tablet Take 1 tablet (5 mg total) by mouth 3 (three) times daily as needed for  muscle spasms. 03/11/14   Mary-Margaret Daphine DeutscherMartin, FNP  furosemide (LASIX) 20 MG tablet Take 1 tablet (20 mg total) by mouth 2 (two) times daily. 03/08/14   Raeford RazorStephen Kohut, MD  ibuprofen (ADVIL,MOTRIN) 800 MG tablet Take 1 tablet (800 mg total) by mouth every 8 (eight) hours as needed. 03/11/14   Mary-Margaret Daphine DeutscherMartin, FNP  naproxen sodium (ANAPROX) 550 MG tablet Take 550 mg by mouth daily as needed for mild pain or moderate pain.    Historical Provider, MD  potassium chloride (K-DUR) 10 MEQ tablet Take 1 tablet (10 mEq total) by mouth 2 (two) times daily. 03/08/14   Raeford RazorStephen Kohut, MD   BP 111/70 mmHg  Pulse 80  Temp(Src) 98.6 F (37 C) (Oral)  Resp 18  Ht 5\' 2"  (1.575 m)  Wt 180 lb (81.647 kg)  BMI 32.91 kg/m2  SpO2 100%  LMP 04/14/2015 Physical Exam  Constitutional: She is oriented to person, place, and time. She appears well-developed and well-nourished. No distress.  HENT:  Head: Normocephalic and atraumatic.  Cardiovascular: Normal rate, regular rhythm, normal heart sounds and intact distal pulses.   Pulmonary/Chest: Effort normal and breath sounds normal.  Musculoskeletal: She exhibits tenderness.  ttp of the left great toe.  DP pulse is brisk,distal sensation intact.  No erythema, abrasion, bruising or bony deformity.  No proximal tenderness.  Neurological:  She is alert and oriented to person, place, and time. She exhibits normal muscle tone. Coordination normal.  Skin: Skin is warm and dry.  < 1 cm lac to the dorsal left great toe.  Bleeding controlled no edema.  Nail is nml appearing.    Nursing note and vitals reviewed.   ED Course  Procedures (including critical care time) Labs Review Labs Reviewed - No data to display  Imaging Review Dg Foot Complete Left  05/24/2015   CLINICAL DATA:  Dropped air conditioner unit on left great toe  EXAM: LEFT FOOT - COMPLETE 3+ VIEW  COMPARISON:  None.  FINDINGS: Three views of the left foot submitted. No acute fracture or subluxation. No  radiopaque foreign body.  IMPRESSION: Negative.   Electronically Signed   By: Natasha Mead M.D.   On: 05/24/2015 17:20     EKG Interpretation None      MDM   Final diagnoses:  Contusion, toe, left, initial encounter  Laceration of toe, initial encounter     XR neg for fx.  Small superficial lac to dorsal toe.  Bleeding controlled, close with single steri-strip.    Post op shoe applied, toes buddy taped.  Remains NV intact and agrees to ortho f/u if needed in one week.  Advised to return here for any signs of infection   Pauline Aus, PA-C 05/27/15 0108  [name], MD 05/27/15 2332

## 2015-08-03 ENCOUNTER — Encounter (HOSPITAL_COMMUNITY): Payer: Self-pay | Admitting: Emergency Medicine

## 2015-08-03 ENCOUNTER — Emergency Department (HOSPITAL_COMMUNITY)
Admission: EM | Admit: 2015-08-03 | Discharge: 2015-08-03 | Disposition: A | Discharge status: Home / Self Care | Payer: Medicaid Other | Attending: Emergency Medicine | Admitting: Emergency Medicine

## 2015-08-03 DIAGNOSIS — M545 Low back pain: Secondary | ICD-10-CM | POA: Diagnosis not present

## 2015-08-03 DIAGNOSIS — R319 Hematuria, unspecified: Secondary | ICD-10-CM | POA: Diagnosis not present

## 2015-08-03 DIAGNOSIS — R300 Dysuria: Secondary | ICD-10-CM | POA: Insufficient documentation

## 2015-08-03 DIAGNOSIS — R531 Weakness: Secondary | ICD-10-CM | POA: Diagnosis not present

## 2015-08-03 DIAGNOSIS — R102 Pelvic and perineal pain: Secondary | ICD-10-CM | POA: Diagnosis present

## 2015-08-03 DIAGNOSIS — Z3202 Encounter for pregnancy test, result negative: Secondary | ICD-10-CM | POA: Diagnosis not present

## 2015-08-03 DIAGNOSIS — N72 Inflammatory disease of cervix uteri: Secondary | ICD-10-CM

## 2015-08-03 DIAGNOSIS — R112 Nausea with vomiting, unspecified: Secondary | ICD-10-CM | POA: Insufficient documentation

## 2015-08-03 DIAGNOSIS — N898 Other specified noninflammatory disorders of vagina: Secondary | ICD-10-CM | POA: Diagnosis not present

## 2015-08-03 LAB — WET PREP, GENITAL
Clue Cells Wet Prep HPF POC: NONE SEEN
Trich, Wet Prep: NONE SEEN
YEAST WET PREP: NONE SEEN

## 2015-08-03 LAB — CBC
HCT: 38.2 % (ref 36.0–46.0)
HEMOGLOBIN: 12.7 g/dL (ref 12.0–15.0)
MCH: 27.9 pg (ref 26.0–34.0)
MCHC: 33.2 g/dL (ref 30.0–36.0)
MCV: 84 fL (ref 78.0–100.0)
PLATELETS: 294 10*3/uL (ref 150–400)
RBC: 4.55 MIL/uL (ref 3.87–5.11)
RDW: 13.8 % (ref 11.5–15.5)
WBC: 10.9 10*3/uL — ABNORMAL HIGH (ref 4.0–10.5)

## 2015-08-03 LAB — URINALYSIS, ROUTINE W REFLEX MICROSCOPIC
Bilirubin Urine: NEGATIVE
GLUCOSE, UA: NEGATIVE mg/dL
Hgb urine dipstick: NEGATIVE
Ketones, ur: NEGATIVE mg/dL
LEUKOCYTES UA: NEGATIVE
NITRITE: NEGATIVE
PH: 6 (ref 5.0–8.0)
Protein, ur: NEGATIVE mg/dL
Urobilinogen, UA: 0.2 mg/dL (ref 0.0–1.0)

## 2015-08-03 LAB — COMPREHENSIVE METABOLIC PANEL
ALBUMIN: 4.2 g/dL (ref 3.5–5.0)
ALK PHOS: 65 U/L (ref 38–126)
ALT: 24 U/L (ref 14–54)
AST: 20 U/L (ref 15–41)
Anion gap: 8 (ref 5–15)
BUN: 9 mg/dL (ref 6–20)
CALCIUM: 9 mg/dL (ref 8.9–10.3)
CO2: 24 mmol/L (ref 22–32)
CREATININE: 0.82 mg/dL (ref 0.44–1.00)
Chloride: 105 mmol/L (ref 101–111)
GFR calc non Af Amer: >60 mL/min (ref >60)
Glucose, Bld: 105 mg/dL — ABNORMAL HIGH (ref 65–99)
Potassium: 3.7 mmol/L (ref 3.5–5.1)
Sodium: 137 mmol/L (ref 135–145)
Total Bilirubin: 0.6 mg/dL (ref 0.3–1.2)
Total Protein: 8.1 g/dL (ref 6.5–8.1)

## 2015-08-03 LAB — PREGNANCY, URINE: PREG TEST UR: NEGATIVE

## 2015-08-03 MED ORDER — CEFTRIAXONE SODIUM 250 MG IJ SOLR
250.0000 mg | Freq: Once | INTRAMUSCULAR | Status: AC
  Administered 2015-08-03: 250 mg via INTRAMUSCULAR
  Filled 2015-08-03: qty 250

## 2015-08-03 MED ORDER — LIDOCAINE HCL (PF) 1 % IJ SOLN
2.0000 mL | Freq: Once | INTRAMUSCULAR | Status: AC
  Administered 2015-08-03: 2 mL
  Filled 2015-08-03: qty 5

## 2015-08-03 MED ORDER — AZITHROMYCIN 250 MG PO TABS
1000.0000 mg | ORAL_TABLET | Freq: Once | ORAL | Status: AC
  Administered 2015-08-03: 1000 mg via ORAL
  Filled 2015-08-03: qty 4

## 2015-08-03 NOTE — Discharge Instructions (Signed)
Cervicitis °Cervicitis is a soreness and swelling (inflammation) of the cervix. Your cervix is located at the bottom of your uterus. It opens up to the vagina. °CAUSES  °· Sexually transmitted infections (STIs).   °· Allergic reaction.   °· Medicines or birth control devices that are put in the vagina.   °· Injury to the cervix.   °· Bacterial infections.   °RISK FACTORS °You are at greater risk if you: °· Have unprotected sexual intercourse. °· Have sexual intercourse with many partners. °· Began sexual intercourse at an early age. °· Have a history of STIs. °SYMPTOMS  °There may be no symptoms. If symptoms occur, they may include:  °· Gray, white, yellow, or bad-smelling vaginal discharge.   °· Pain or itching of the area outside the vagina.   °· Painful sexual intercourse.   °· Lower abdominal or lower back pain, especially during intercourse.   °· Frequent urination.   °· Abnormal vaginal bleeding between periods, after sexual intercourse, or after menopause.   °· Pressure or a heavy feeling in the pelvis.   °DIAGNOSIS  °Diagnosis is made after a pelvic exam. Other tests may include:  °· Examination of any discharge under a microscope (wet prep).   °· A Pap test.   °TREATMENT  °Treatment will depend on the cause of cervicitis. If it is caused by an STI, both you and your partner will need to be treated. Antibiotic medicines will be given.  °HOME CARE INSTRUCTIONS  °· Do not have sexual intercourse until your health care provider says it is okay.   °· Do not have sexual intercourse until your partner has been treated, if your cervicitis is caused by an STI.   °· Take your antibiotics as directed. Finish them even if you start to feel better.   °SEEK MEDICAL CARE IF: °· Your symptoms come back.   °· You have a fever.   °MAKE SURE YOU:  °· Understand these instructions. °· Will watch your condition. °· Will get help right away if you are not doing well or get worse. °Document Released: 12/17/2005 Document Revised:  12/22/2013 Document Reviewed: 06/10/2013 °ExitCare® Patient Information ©2015 [employer]. This information is not intended to replace advice given to you by your health care provider. Make sure you discuss any questions you have with your health care provider. ° °

## 2015-08-03 NOTE — ED Notes (Signed)
Pt reports urinary symptoms for last several days. Pt reports taking otc azo with no relief. Pt reports then vaginal itching began. Pt reports after trying otc yeast infection product pt now reports vaginal bleeding and lower back pain.

## 2015-08-03 NOTE — ED Provider Notes (Signed)
CSN: [id number]     Arrival date & time 08/03/15  1429 History   First MD Initiated Contact with Patient 08/03/15 1714     Chief Complaint  Patient presents with  . Abdominal Pain     (Consider location/radiation/quality/duration/timing/severity/associated sxs/prior Treatment) HPI Comments: Patient reports she had urinary frequency and urgency last week. She took Azo from [place] with partial relief. She then began to have vaginal itching and thought she had a yeast infection. She then use Monistat which reports no improvement. She now has some vaginal pain and vaginal bleeding and low back pain. She had 3 episodes of vomiting yesterday. She still has dysuria and hematuria. She denies any fever, chills, diarrhea. She denies any chest pain or shortness of breath. She is not on any birth control. She denies any focal weakness, numbness or tingling. No bowel or bladder incontinence.  The history is provided by the patient.    Past Medical History  Diagnosis Date  . Chronic headaches   . Neck pain    Past Surgical History  Procedure Laterality Date  . Knee surgery Right 2011  . Tonsillectomy Bilateral    Family History  Problem Relation Age of Onset  . Adopted: Yes  . Heart failure Father     Died at 106  . Bipolar disorder Mother   . Pneumonia Mother     Died at 2   History  Substance Use Topics  . Smoking status: Passive Smoke Exposure - Never Smoker  . Smokeless tobacco: Never Used  . Alcohol Use: No   OB History    Gravida Para Term Preterm AB TAB SAB Ectopic Multiple Living   0 0 0 0 0 0 0 0 0 0      Review of Systems  Constitutional: Negative for fever, activity change and appetite change.  HENT: Negative for congestion and rhinorrhea.   Respiratory: Negative for cough, chest tightness and shortness of breath.   Cardiovascular: Negative for chest pain and leg swelling.  Gastrointestinal: Positive for nausea, vomiting and abdominal pain.  Genitourinary: Positive for  dysuria, hematuria, vaginal bleeding and vaginal discharge.  Musculoskeletal: Positive for back pain. Negative for myalgias and arthralgias.  Skin: Negative for rash.  Neurological: Positive for weakness. Negative for dizziness, light-headedness and headaches.  A complete 10 system review of systems was obtained and all systems are negative except as noted in the HPI and PMH.      Allergies  Review of patient's allergies indicates no known allergies.  Home Medications   Prior to Admission medications   Not on File   BP 119/61 mmHg  Pulse 66  Temp(Src) 98.6 F (37 C) (Oral)  Resp 18  Ht 5\' 2"  (1.575 m)  Wt 190 lb (86.183 kg)  BMI 34.74 kg/m2  SpO2 99%  LMP 07/10/2015 Physical Exam  Constitutional: She is oriented to person, place, and time. She appears well-developed and well-nourished. No distress.  HENT:  Head: Normocephalic and atraumatic.  Mouth/Throat: Oropharynx is clear and moist. No oropharyngeal exudate.  Eyes: Conjunctivae and EOM are normal. Pupils are equal, round, and reactive to light.  Neck: Normal range of motion. Neck supple.  No meningismus.  Cardiovascular: Normal rate, regular rhythm, normal heart sounds and intact distal pulses.   No murmur heard. Pulmonary/Chest: Effort normal and breath sounds normal. No respiratory distress.  Abdominal: Soft. There is tenderness. There is no rebound and no guarding.  Mild left-sided tenderness, no guarding or rebound  Genitourinary:  Chaperone present. Normal external  genitalia. White discharge in vaginal vault. Minimal cervical tenderness. No lateralizing adnexal tenderness.  Musculoskeletal: Normal range of motion. She exhibits no edema or tenderness.  Left paraspinal tenderness, no CVA tenderness  Neurological: She is alert and oriented to person, place, and time. No cranial nerve deficit. She exhibits normal muscle tone. Coordination normal.  No ataxia on finger to nose bilaterally. No pronator drift. 5/5  strength throughout. CN 2-12 intact. Negative Romberg. Equal grip strength. Sensation intact. Gait is normal.   Skin: Skin is warm.  Psychiatric: She has a normal mood and affect. Her behavior is normal.  Nursing note and vitals reviewed.   ED Course  Procedures (including critical care time) Labs Review Labs Reviewed  WET PREP, GENITAL - Abnormal; Notable for the following:    WBC, Wet Prep HPF POC FEW (*)    All other components within normal limits  COMPREHENSIVE METABOLIC PANEL - Abnormal; Notable for the following:    Glucose, Bld 105 (*)    All other components within normal limits  CBC - Abnormal; Notable for the following:    WBC 10.9 (*)    All other components within normal limits  URINALYSIS, ROUTINE W REFLEX MICROSCOPIC (NOT AT [facility]) - Abnormal; Notable for the following:    Specific Gravity, Urine >1.030 (*)    All other components within normal limits  PREGNANCY, URINE  GC/CHLAMYDIA PROBE AMP ([place]) NOT AT [facility]    Imaging Review No results found.   EKG Interpretation None      MDM   Final diagnoses:  Vaginal discharge  Cervicitis   Urinary symptoms last week now with vaginal itching and bleeding.  Urinalysis done in triage is negative.  Patient with mild cervical tenderness on exam. We'll treat empirically for cervicitis.  Wet prep is negative. Urinalysis is negative. Pregnancy test is negative.  Patient treated empirically for cervicitis. Referral to gynecology given.  Return Precautions discussed.  Glynn Octave, MD 08/04/15 [phone]

## 2015-08-04 LAB — GC/CHLAMYDIA PROBE AMP (~~LOC~~) NOT AT ARMC
Chlamydia: NEGATIVE
NEISSERIA GONORRHEA: NEGATIVE

## 2015-11-16 ENCOUNTER — Ambulatory Visit: Payer: Medicaid Other | Admitting: Pediatrics

## 2015-11-16 ENCOUNTER — Ambulatory Visit: Payer: Medicaid Other | Admitting: Family Medicine

## 2015-11-17 ENCOUNTER — Ambulatory Visit: Payer: Medicaid Other | Admitting: Pediatrics

## 2015-11-18 ENCOUNTER — Encounter: Payer: Self-pay | Admitting: General Practice

## 2015-12-22 ENCOUNTER — Encounter: Payer: Self-pay | Admitting: Pediatrics

## 2015-12-22 ENCOUNTER — Ambulatory Visit (INDEPENDENT_AMBULATORY_CARE_PROVIDER_SITE_OTHER): Payer: Medicaid Other | Admitting: Pediatrics

## 2015-12-22 VITALS — BP 125/77 | HR 81 | Temp 98.0°F | Ht 62.0 in | Wt 209.0 lb

## 2015-12-22 DIAGNOSIS — F329 Major depressive disorder, single episode, unspecified: Secondary | ICD-10-CM | POA: Diagnosis not present

## 2015-12-22 DIAGNOSIS — F32A Depression, unspecified: Secondary | ICD-10-CM | POA: Insufficient documentation
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
## 2015-12-29 ENCOUNTER — Ambulatory Visit: Payer: Medicaid Other | Admitting: Pediatrics

## 2016-01-03 ENCOUNTER — Encounter: Payer: Self-pay | Admitting: Pediatrics

## 2016-09-07 ENCOUNTER — Encounter (HOSPITAL_COMMUNITY): Payer: Self-pay | Admitting: Emergency Medicine

## 2016-09-07 ENCOUNTER — Emergency Department (HOSPITAL_COMMUNITY)
Admission: EM | Admit: 2016-09-07 | Discharge: 2016-09-07 | Disposition: A | Discharge status: Home / Self Care | Payer: Medicaid Other | Attending: Dermatology | Admitting: Dermatology

## 2016-09-07 ENCOUNTER — Emergency Department (HOSPITAL_COMMUNITY): Payer: Medicaid Other

## 2016-09-07 DIAGNOSIS — M79644 Pain in right finger(s): Secondary | ICD-10-CM | POA: Diagnosis not present

## 2016-09-07 DIAGNOSIS — M79631 Pain in right forearm: Secondary | ICD-10-CM | POA: Diagnosis not present

## 2016-09-07 DIAGNOSIS — Z5321 Procedure and treatment not carried out due to patient leaving prior to being seen by health care provider: Secondary | ICD-10-CM | POA: Insufficient documentation

## 2016-09-07 DIAGNOSIS — F17210 Nicotine dependence, cigarettes, uncomplicated: Secondary | ICD-10-CM | POA: Diagnosis not present

## 2016-09-07 LAB — POC URINE PREG, ED: Preg Test, Ur: NEGATIVE
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
## 2016-11-22 ENCOUNTER — Encounter (HOSPITAL_COMMUNITY): Payer: Self-pay | Admitting: *Deleted

## 2016-11-22 ENCOUNTER — Inpatient Hospital Stay (HOSPITAL_COMMUNITY)
Admission: AD | Admit: 2016-11-22 | Discharge: 2016-11-24 | DRG: 312 | Disposition: A | Discharge status: Home / Self Care | Payer: Medicaid Other | Source: Other Acute Inpatient Hospital | Attending: Cardiovascular Disease | Admitting: Cardiovascular Disease

## 2016-11-22 ENCOUNTER — Observation Stay (HOSPITAL_COMMUNITY): Payer: Medicaid Other

## 2016-11-22 DIAGNOSIS — Z8249 Family history of ischemic heart disease and other diseases of the circulatory system: Secondary | ICD-10-CM

## 2016-11-22 DIAGNOSIS — R55 Syncope and collapse: Principal | ICD-10-CM

## 2016-11-22 DIAGNOSIS — R4182 Altered mental status, unspecified: Secondary | ICD-10-CM

## 2016-11-22 DIAGNOSIS — F17210 Nicotine dependence, cigarettes, uncomplicated: Secondary | ICD-10-CM | POA: Diagnosis present

## 2016-11-22 HISTORY — DX: Syncope and collapse: R55

## 2016-11-22 LAB — PROTIME-INR
INR: 1.02
PROTHROMBIN TIME: 13.4 s (ref 11.4–15.2)

## 2016-11-22 LAB — CBC
HCT: 37.9 % (ref 36.0–46.0)
Hemoglobin: 12.8 g/dL (ref 12.0–15.0)
MCH: 29.2 pg (ref 26.0–34.0)
MCHC: 33.8 g/dL (ref 30.0–36.0)
MCV: 86.3 fL (ref 78.0–100.0)
Platelets: 259 10*3/uL (ref 150–400)
RBC: 4.39 MIL/uL (ref 3.87–5.11)
RDW: 13.2 % (ref 11.5–15.5)
WBC: 9.5 10*3/uL (ref 4.0–10.5)

## 2016-11-22 LAB — BASIC METABOLIC PANEL
ANION GAP: 7 (ref 5–15)
BUN: 6 mg/dL (ref 6–20)
CHLORIDE: 105 mmol/L (ref 101–111)
CO2: 26 mmol/L (ref 22–32)
Calcium: 9.2 mg/dL (ref 8.9–10.3)
Creatinine, Ser: 0.76 mg/dL (ref 0.44–1.00)
GFR calc Af Amer: >60 mL/min (ref >60)
Glucose, Bld: 93 mg/dL (ref 65–99)
POTASSIUM: 3.4 mmol/L — AB (ref 3.5–5.1)
SODIUM: 138 mmol/L (ref 135–145)

## 2016-11-22 LAB — TSH
TSH: 1.31 u[IU]/mL (ref 0.350–4.500)
TSH: 0.954 u[IU]/mL (ref 0.350–4.500)

## 2016-11-22 LAB — MAGNESIUM: MAGNESIUM: 2.2 mg/dL (ref 1.7–2.4)

## 2016-11-22 MED ORDER — ACETAMINOPHEN 325 MG PO TABS: 650.0000 mg | ORAL_TABLET | ORAL | Status: DC | PRN

## 2016-11-22 MED ORDER — SODIUM CHLORIDE 0.9% FLUSH: 3.0000 mL | INTRAVENOUS | Status: DC | PRN

## 2016-11-22 MED ORDER — ONDANSETRON HCL 4 MG/2ML IJ SOLN: 4.0000 mg | Freq: Four times a day (QID) | INTRAMUSCULAR | Status: DC | PRN

## 2016-11-22 MED ORDER — SODIUM CHLORIDE 0.9% FLUSH
3.0000 mL | Freq: Two times a day (BID) | INTRAVENOUS | Status: DC
  Administered 2016-11-23: 3 mL via INTRAVENOUS

## 2016-11-22 MED ORDER — SODIUM CHLORIDE 0.9 % IV SOLN
250.0000 mL | INTRAVENOUS | Status: DC | PRN
Start: 2016-11-22 — End: 2016-11-24
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
## 2016-11-22 NOTE — Progress Notes (Signed)
PA on call for Cardiology paged through [facility] and made aware patient has arrived to floor. [place]. Ezrah Dembeck, Randall AnKristin Jessup RN

## 2016-11-23 ENCOUNTER — Observation Stay (HOSPITAL_BASED_OUTPATIENT_CLINIC_OR_DEPARTMENT_OTHER): Payer: Medicaid Other

## 2016-11-23 ENCOUNTER — Other Ambulatory Visit: Payer: Self-pay

## 2016-11-23 ENCOUNTER — Observation Stay (HOSPITAL_COMMUNITY): Payer: Medicaid Other

## 2016-11-23 ENCOUNTER — Encounter (HOSPITAL_COMMUNITY)
Admission: AD | Disposition: A | Discharge status: Home / Self Care | Payer: Self-pay | Source: Other Acute Inpatient Hospital | Attending: Cardiovascular Disease

## 2016-11-23 DIAGNOSIS — Z8249 Family history of ischemic heart disease and other diseases of the circulatory system: Secondary | ICD-10-CM | POA: Diagnosis not present

## 2016-11-23 DIAGNOSIS — F17210 Nicotine dependence, cigarettes, uncomplicated: Secondary | ICD-10-CM | POA: Diagnosis present

## 2016-11-23 DIAGNOSIS — R55 Syncope and collapse: Secondary | ICD-10-CM | POA: Diagnosis present

## 2016-11-23 LAB — COMPREHENSIVE METABOLIC PANEL
ALBUMIN: 3.7 g/dL (ref 3.5–5.0)
ALK PHOS: 56 U/L (ref 38–126)
ALT: 16 U/L (ref 14–54)
ANION GAP: 8 (ref 5–15)
AST: 15 U/L (ref 15–41)
BILIRUBIN TOTAL: 0.3 mg/dL (ref 0.3–1.2)
BUN: 10 mg/dL (ref 6–20)
CALCIUM: 9.1 mg/dL (ref 8.9–10.3)
CO2: 22 mmol/L (ref 22–32)
Chloride: 109 mmol/L (ref 101–111)
Creatinine, Ser: 0.83 mg/dL (ref 0.44–1.00)
GFR calc Af Amer: >60 mL/min (ref >60)
GFR calc non Af Amer: >60 mL/min (ref >60)
GLUCOSE: 102 mg/dL — AB (ref 65–99)
Potassium: 4.2 mmol/L (ref 3.5–5.1)
Sodium: 139 mmol/L (ref 135–145)
TOTAL PROTEIN: 7.5 g/dL (ref 6.5–8.1)

## 2016-11-23 LAB — PROTIME-INR
INR: 0.97
PROTHROMBIN TIME: 12.9 s (ref 11.4–15.2)

## 2016-11-23 LAB — CBC
HCT: 40.6 % (ref 36.0–46.0)
Hemoglobin: 13.7 g/dL (ref 12.0–15.0)
MCH: 29.6 pg (ref 26.0–34.0)
MCHC: 33.7 g/dL (ref 30.0–36.0)
MCV: 87.7 fL (ref 78.0–100.0)
PLATELETS: 272 10*3/uL (ref 150–400)
RBC: 4.63 MIL/uL (ref 3.87–5.11)
RDW: 13.2 % (ref 11.5–15.5)
WBC: 9 10*3/uL (ref 4.0–10.5)

## 2016-11-23 LAB — PREGNANCY, URINE: PREG TEST UR: NEGATIVE

## 2016-11-23 LAB — ECHOCARDIOGRAM COMPLETE: WEIGHTICAEL: 2840 [oz_av]

## 2016-11-23 LAB — HEMOGLOBIN A1C
Hgb A1c MFr Bld: 5.3 % (ref 4.8–5.6)
Mean Plasma Glucose: 105 mg/dL

## 2016-11-23 LAB — LIPID PANEL
Cholesterol: 137 mg/dL (ref 0–200)
HDL: 40 mg/dL — ABNORMAL LOW (ref >40)
LDL Cholesterol: 83 mg/dL (ref 0–99)
Total CHOL/HDL Ratio: 3.4 RATIO
Triglycerides: 71 mg/dL (ref <150)
VLDL: 14 mg/dL (ref 0–40)

## 2016-11-23 SURGERY — ELECTROPHYSIOLOGY STUDY
Anesthesia: LOCAL

## 2016-11-23 MED ORDER — GADOBENATE DIMEGLUMINE 529 MG/ML IV SOLN
30.0000 mL | Freq: Once | INTRAVENOUS | Status: AC | PRN
  Administered 2016-11-23: 27 mL via INTRAVENOUS

## 2016-11-23 MED ORDER — FLECAINIDE ACETATE 50 MG PO TABS
300.0000 mg | ORAL_TABLET | Freq: Once | ORAL | Status: AC
  Administered 2016-11-23: 300 mg via ORAL
  Filled 2016-11-23: qty 6

## 2016-11-23 NOTE — Progress Notes (Signed)
IV infiltration of approximately 15 ml MultiHance. Patient complained of stinging/burning. IV assessed at that time. Infiltration was noted. IV was removed and new site started in left [facility]. RN notified.

## 2016-11-23 NOTE — Progress Notes (Signed)
Patient Name: Karen Chafellie L XXXRichardson Date of Encounter: 11/23/2016  Primary Cardiologist:   Dr. Elease HashimotoNahser (new)  Hospital Problem List     Active Problems:   Syncope     Subjective   No palpitations.  No chest pain.  No SOB.   Inpatient Medications    Scheduled Meds: . sodium chloride flush  3 mL Intravenous Q12H   Continuous Infusions:  PRN Meds: sodium chloride, acetaminophen, ondansetron (ZOFRAN) IV, sodium chloride flush   Vital Signs    Vitals:   11/22/16 1035 11/22/16 1336 11/22/16 2031 11/23/16 0600  BP: 107/69 (!) 102/46 (!) 119/56 113/68  Pulse: 76 64 (!) 51 61  Resp: 19 19 18 18   Temp: 97.6 F (36.4 C) 97.5 F (36.4 C) 97.7 F (36.5 C) 97.5 F (36.4 C)  TempSrc: Oral Axillary Oral Oral  SpO2: 100% 100% 100% 100%  Weight:    177 lb 8 oz (80.5 kg)   No intake or output data in the 24 hours ending 11/23/16 1021 Filed Weights   11/23/16 0600  Weight: 177 lb 8 oz (80.5 kg)    Physical Exam    GEN: NAD.  Neck:  no JVD Cardiac: Regular Rate and Rhythm, nomurmurs, rubs, or gallops.  noedema.  Radials/DP/PT 2+  and equal bilaterally.  Respiratory:  Respirations  regular and unlabored, clear to auscultation bilaterally. GI: Soft, nontender, nondistended, BS + x 4. Skin: warm and dry, no rash. Neuro:   Strength and sensation are intact. Psych:  AAOx3.  Normal affect.  Labs    CBC  Recent Labs  11/22/16 1203 11/23/16 0417  WBC 9.5 9.0  HGB 12.8 13.7  HCT 37.9 40.6  MCV 86.3 87.7  PLT 259 272   Basic Metabolic Panel  Recent Labs  11/22/16 1203 11/23/16 0417  NA 138 139  K 3.4* 4.2  CL 105 109  CO2 26 22  GLUCOSE 93 102*  BUN 6 10  CREATININE 0.76 0.83  CALCIUM 9.2 9.1  MG 2.2  --    Liver Function Tests  Recent Labs  11/23/16 0417  AST 15  ALT 16  ALKPHOS 56  BILITOT 0.3  PROT 7.5  ALBUMIN 3.7   No results for input(s): LIPASE, AMYLASE in the last 72 hours. Cardiac Enzymes No results for input(s): CKTOTAL, CKMB,  CKMBINDEX, TROPONINI in the last 72 hours. BNP Invalid input(s): POCBNP D-Dimer No results for input(s): DDIMER in the last 72 hours. Hemoglobin A1C No results for input(s): HGBA1C in the last 72 hours. Fasting Lipid Panel  Recent Labs  11/23/16 0417  CHOL 137  HDL 40*  LDLCALC 83  TRIG 71  CHOLHDL 3.4   Thyroid Function Tests  Recent Labs  11/22/16 1407  TSH 0.954    Telemetry    NSR - Personally Reviewed  ECG    NSR, narrow complex, axis and intervals WNL - Personally Reviewed  Radiology    Ct Head Wo Contrast  Result Date: 11/22/2016 CLINICAL DATA:  20 year old female with acute altered mental status dizziness and headache today. EXAM: CT HEAD WITHOUT CONTRAST TECHNIQUE: Contiguous axial images were obtained from the base of the skull through the vertex without intravenous contrast. COMPARISON:  11/22/2016 FINDINGS: Brain: No intracranial abnormalities are identified, including mass lesion or mass effect, hydrocephalus, extra-axial fluid collection, midline shift, hemorrhage, or acute infarction. Vascular: No hyperdense vessel or unexpected calcification. Skull: Normal. Negative for fracture or focal lesion. Sinuses/Orbits: Small amount of fluid within the maxillary sinuses again noted. No other  abnormalities identified. Other: None. IMPRESSION: No evidence of intracranial abnormality. Air-fluid levels within both maxillary sinuses which may represent acute sinusitis. Electronically Signed   By: Harmon PierJeffrey  Hu M.D.   On: 11/22/2016 15:47    Cardiac Studies   Echo pending,  MR Cardiac pending  Patient Profile     Patient is a 20 y.o. female with no significant PMH , who was [facility]22[facility]2016 for evaluation of syncope.  Assessment & Plan    QUESTIONABLE SYNCOPE:  Reported RBBB but now with narrow complex.  I cannot confirm the EKG with RBBB it is reported by EMS and mentioned by Dr. Elease HashimotoNahser in the ED.    Echo is being done.  MR is ordered.  I will ask EP  to see per Dr. Elease HashimotoNahser.  The patient has had some orthostatic symptoms and I will order orthostatic BPs.    Signed, Rollene RotundaJames Maysen Sudol, MD  11/23/2016, 10:21 AM

## 2016-11-23 NOTE — Consult Note (Signed)
ELECTROPHYSIOLOGY CONSULT NOTE    Patient ID: Shannon Galloway MRN: [id number], DOB/AGE: 21/04/1996 21 y.o.  Admit date: 11/22/2016 Date of Consult: 11/23/2016   Primary Physician: No PCP Per Patient Primary Cardiologist: [facility], Dr. Elease HashimotoNahser Requesting MD: Dr. Elease HashimotoNahser  Reason for Consultation:  recurrent syncope, abnormal EKG  HPI: Shannon Galloway is a 20 y.o. female with no knowm PMHx, [facility]   Syncope hx: She reports sevral syncopal episodes mainly when emotionally upset,noneassociated illness or fever. 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  Had possibly palpitations prior to her episodes.  Has been sleeping poorly over the last few days.   She is adopted though after the death of her parents She was told her mother died possibly of OD +/- a pneumonia Her father died after his 7th MI in his 4240's A sister died in infancy labeled SIDS  Past Medical History:  Diagnosis Date  . Chronic headaches   . Neck pain      Surgical History:  Past Surgical History:  Procedure Laterality Date  . KNEE SURGERY Right 2011  . TONSILLECTOMY Bilateral      No prescriptions prior to admission.    Inpatient Medications:  . sodium chloride flush  3 mL Intravenous Q12H    Allergies: No Known Allergies  Social History   Social History  . Marital status: Single    Spouse name: N/A  . Number of children: N/A  . Years of education: N/A   Occupational History  . Not on file.   Social History Main Topics  . Smoking status: Current Every Day Smoker    Packs/day: 0.01    Years: 0.01    Types: Cigarettes  . Smokeless tobacco: Never Used  . Alcohol use No  . Drug use: No  . Sexual activity: Yes    Birth control/ protection: None   Other Topics Concern  . Not on file   Social History Narrative  . No narrative on file     Family History  Problem Relation Age of  Onset  . Adopted: Yes  . Heart failure Father     Died at 7041  . Bipolar disorder Mother   . Pneumonia Mother     Died at 3239     Review of Systems: All other systems reviewed and are otherwise negative except as noted above.  Physical Exam: Vitals:   11/22/16 1035 11/22/16 1336 11/22/16 2031 11/23/16 0600  BP: 107/69 (!) 102/46 (!) 119/56 113/68  Pulse: 76 64 (!) 51 61  Resp: 19 19 18 18   Temp: 97.6 F (36.4 C) 97.5 F (36.4 C) 97.7 F (36.5 C) 97.5 F (36.4 C)  TempSrc: Oral Axillary Oral Oral  SpO2: 100% 100% 100% 100%  Weight:    177 lb 8 oz (80.5 kg)    GEN- The patient is well appearing, alert and oriented x 3 today.   HEENT: normocephalic, atraumatic; sclera clear, conjunctiva pink; hearing intact; oropharynx clear; neck supple, no JVP Lymph- no cervical lymphadenopathy Lungs- Clear to ausculation bilaterally, normal work of breathing.  No wheezes, rales, rhonchi Heart- Regular rate and rhythm, no murmurs, rubs or gallops, PMI not laterally displaced GI- soft, non-tender, non-distended, bowel sounds present Extremities- no clubbing, cyanosis, or edema; DP/PT/radial pulses 2+ bilaterally MS- no significant deformity or atrophy Skin- warm and dry, no rash or lesion Psych- euthymic mood, full affect Neuro- no gross  deficits observed  Labs:   Lab Results  Component Value Date   WBC 9.0 11/23/2016   HGB 13.7 11/23/2016   HCT 40.6 11/23/2016   MCV 87.7 11/23/2016   PLT 272 11/23/2016    Recent Labs Lab 11/23/16 0417  NA 139  K 4.2  CL 109  CO2 22  BUN 10  CREATININE 0.83  CALCIUM 9.1  PROT 7.5  BILITOT 0.3  ALKPHOS 56  ALT 16  AST 15  GLUCOSE 102*      Radiology/Studies:  Ct Head Wo Contrast Result Date: 11/22/2016 CLINICAL DATA:  20 year old female with acute altered mental status dizziness and headache today. EXAM: CT HEAD WITHOUT CONTRAST TECHNIQUE: Contiguous axial images were obtained from the base of the skull through the vertex without  intravenous contrast. COMPARISON:  11/22/2016 FINDINGS: Brain: No intracranial abnormalities are identified, including mass lesion or mass effect, hydrocephalus, extra-axial fluid collection, midline shift, hemorrhage, or acute infarction. Vascular: No hyperdense vessel or unexpected calcification. Skull: Normal. Negative for fracture or focal lesion. Sinuses/Orbits: Small amount of fluid within the maxillary sinuses again noted. No other abnormalities identified. Other: None. IMPRESSION: No evidence of intracranial abnormality. Air-fluid levels within both maxillary sinuses which may represent acute sinusitis. Electronically Signed   By: Harmon PierJeffrey  Hu M.D.   On: 11/22/2016 15:47    EKG: sinus rhythm, intermittent RBBB TELEMETRY: SR    Assessment and Plan:   1. Syncope: at this time, she has a RBBB, as well as intermittent conduction system disease.  Due to the intermittent RBBB, would plan for flecainide challenge to see if she has brugada syndrome.  Agree with further workup with [facility] as previously planned as well. Should she have brugada syndrome, would possibly benefit from ICD placement.   The patient is aware of [place] law, no driving for 6 months  Signed, Francis DowseRenee Ursuy, PA-C 11/23/2016 11:53 AM

## 2016-11-23 NOTE — Progress Notes (Signed)
2D echocardiogram has been performed. 

## 2016-11-24 ENCOUNTER — Other Ambulatory Visit: Payer: Self-pay | Admitting: Physician Assistant

## 2016-11-24 ENCOUNTER — Encounter (HOSPITAL_COMMUNITY): Payer: Self-pay | Admitting: Physician Assistant

## 2016-11-24 DIAGNOSIS — R55 Syncope and collapse: Secondary | ICD-10-CM

## 2016-11-24 NOTE — Discharge Instructions (Signed)
You are not able to drive for at least 6 months. The office will call you to set up a heart monitor.

## 2016-11-24 NOTE — Progress Notes (Signed)
Patient Name: Shannon Galloway Date of Encounter: 11/24/2016  Hospital Problem List     Active Problems:   Syncope     Subjective   Currently feels well without complaint.  Flecainide challenge yesterday without evidence of Brugada syndrome. Sinus rhythm overnight.  Inpatient Medications    Scheduled Meds: . sodium chloride flush  3 mL Intravenous Q12H   Continuous Infusions:  PRN Meds: sodium chloride, acetaminophen, ondansetron (ZOFRAN) IV, sodium chloride flush   Vital Signs    Vitals:   11/22/16 2031 11/23/16 0600 11/23/16 2109 11/24/16 0559  BP: (!) 119/56 113/68 106/68 111/64  Pulse: (!) 51 61 87 66  Resp: 18 18 18 18   Temp: 97.7 F (36.5 C) 97.5 F (36.4 C) 98.7 F (37.1 C) 97.9 F (36.6 C)  TempSrc: Oral Oral Oral Oral  SpO2: 100% 100% 100% 100%  Weight:  177 lb 8 oz (80.5 kg)  175 lb 14.4 oz (79.8 kg)   No intake or output data in the 24 hours ending 11/24/16 0803 Filed Weights   11/23/16 0600 11/24/16 0559  Weight: 177 lb 8 oz (80.5 kg) 175 lb 14.4 oz (79.8 kg)    Physical Exam    GEN: Well nourished, well developed, in no acute distress.  HEENT: Grossly normal.  Neck: Supple, no JVD, carotid bruits, or masses. Cardiac: RRR, no murmurs, rubs, or gallops. No clubbing, cyanosis, edema.  Radials/DP/PT 2+ and equal bilaterally.  Respiratory:  Respirations regular and unlabored, clear to auscultation bilaterally. GI: Soft, nontender, nondistended, BS + x 4. MS: no deformity or atrophy. Skin: warm and dry, no rash. Neuro:  Strength and sensation are intact. Psych: AAOx3.  Normal affect.  Labs    CBC  Recent Labs  11/22/16 1203 11/23/16 0417  WBC 9.5 9.0  HGB 12.8 13.7  HCT 37.9 40.6  MCV 86.3 87.7  PLT 259 272   Basic Metabolic Panel  Recent Labs  11/22/16 1203 11/23/16 0417  NA 138 139  K 3.4* 4.2  CL 105 109  CO2 26 22  GLUCOSE 93 102*  BUN 6 10  CREATININE 0.76 0.83  CALCIUM 9.2 9.1  MG 2.2  --    Liver Function  Tests  Recent Labs  11/23/16 0417  AST 15  ALT 16  ALKPHOS 56  BILITOT 0.3  PROT 7.5  ALBUMIN 3.7   No results for input(s): LIPASE, AMYLASE in the last 72 hours. Cardiac Enzymes No results for input(s): CKTOTAL, CKMB, CKMBINDEX, TROPONINI in the last 72 hours. BNP Invalid input(s): POCBNP D-Dimer No results for input(s): DDIMER in the last 72 hours. Hemoglobin A1C  Recent Labs  11/22/16 1407  HGBA1C 5.3   Fasting Lipid Panel  Recent Labs  11/23/16 0417  CHOL 137  HDL 40*  LDLCALC 83  TRIG 71  CHOLHDL 3.4   Thyroid Function Tests  Recent Labs  11/22/16 1407  TSH 0.954    Telemetry    Sinus rhythm - Personally Reviewed  ECG    Sinus rhythm - Personally Reviewed  Flecainide challenge revealed no evidence of Brugada syndrome  Radiology    Ct Head Wo Contrast  Result Date: 11/22/2016 CLINICAL DATA:  20 year old female with acute altered mental status dizziness and headache today. EXAM: CT HEAD WITHOUT CONTRAST TECHNIQUE: Contiguous axial images were obtained from the base of the skull through the vertex without intravenous contrast. COMPARISON:  11/22/2016 FINDINGS: Brain: No intracranial abnormalities are identified, including mass lesion or mass effect, hydrocephalus, extra-axial fluid collection, midline  shift, hemorrhage, or acute infarction. Vascular: No hyperdense vessel or unexpected calcification. Skull: Normal. Negative for fracture or focal lesion. Sinuses/Orbits: Small amount of fluid within the maxillary sinuses again noted. No other abnormalities identified. Other: None. IMPRESSION: No evidence of intracranial abnormality. Air-fluid levels within both maxillary sinuses which may represent acute sinusitis. Electronically Signed   By: Harmon PierJeffrey  Hu M.D.   On: 11/22/2016 15:47   Mr Cardiac Morphology W Wo Contrast  Result Date: 11/23/2016 CLINICAL DATA:  20 year old female with recurrent syncope and family history of SCD. EXAM: CARDIAC MRI  TECHNIQUE: The patient was scanned on a 1.5 Tesla GE magnet. A dedicated cardiac coil was used. Functional imaging was done using Fiesta sequences. 2,3, and 4 chamber views were done to assess for RWMA's. Modified Simpson's rule using a short axis stack was used to calculate an ejection fraction on a dedicated work Research officer, trade unionstation using Circle software. The patient received 35 cc of Multihance. After 10 minutes inversion recovery sequences were used to assess for infiltration and scar tissue. CONTRAST:  35 cc  of Multihance FINDINGS: 1. Normal left ventricular size, thickness and systolic function (LVEF = 61%) with no regional wall motion abnormalities. LVEDD:  47 mm LVESD:  27 mm LVEDV:  114 ml LVESV:  44 ml SV:  70 ml CO:  4.7 L/min Myocardial mass:  108 g 2. Normal right ventricular size, thickness and systolic function (LVEF = 59%) with no regional wall motion abnormalities. 3.  Normal left and right atrial size. 4. Normal size of the aortic root, thoracic aorta and pulmonary artery. 5. No late gadolinium enhancement in the left or right ventricular myocardium. 6. Normal pericardium. Trivial amount of inferiorly located pericardial effusion. IMPRESSION: 1. Normal left ventricular size, thickness and systolic function (LVEF = 61%) with no regional wall motion abnormalities. 2. Normal right ventricular size, thickness and systolic function (LVEF = 59%) with no regional wall motion abnormalities. 3.  Normal left and right atrial size. 4. Normal size of the aortic root, thoracic aorta and pulmonary artery. 5. No late gadolinium enhancement in the left or right ventricular myocardium. 6. Normal pericardium. Trivial amount of inferiorly located pericardial effusion. Collectively, there is no evidence for left ventricular infiltrative or inflammatory left ventricular cardiomyopathy or arrhythmogenic right ventricular cardiomyopathy. Tobias AlexanderKatarina Nelson Electronically Signed   By: Tobias AlexanderKatarina  Nelson   On: 11/23/2016 16:37     Cardiac Studies   [facility] IMPRESSION: 1. Normal left ventricular size, thickness and systolic function (LVEF = 61%) with no regional wall motion abnormalities.  2. Normal right ventricular size, thickness and systolic function (LVEF = 59%) with no regional wall motion abnormalities.  3.  Normal left and right atrial size.  4. Normal size of the aortic root, thoracic aorta and pulmonary artery.  5. No late gadolinium enhancement in the left or right ventricular myocardium.  6. Normal pericardium. Trivial amount of inferiorly located pericardial effusion.  Collectively, there is no evidence for left ventricular infiltrative or inflammatory left ventricular cardiomyopathy or arrhythmogenic right ventricular cardiomyopathy.  TTE . - Left ventricle: The cavity size was normal. Systolic function was   normal. Wall motion was normal; there were no regional wall   motion abnormalities. Left ventricular diastolic function   parameters were normal.  Assessment & Plan    Syncope: Currently, the cause of her syncope is unclear. 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 She does understand that she should not drive for  the next 6 months. She had a flecainide challenge yesterday which showed no evidence of Brugada syndrome. She has remained in sinus rhythm on the monitor throughout her hospital stay. [facility] did [facility] that had a wide right bundle and a right axis deviation. In calling [facility], they only have one EKG that shows that she was in sinus rhythm. Unfortunately the copy of the right bundle EKG does not have her name written on it and is possible that this is on a different patient. That being said, we'll set her up for a 30 day monitor and follow her up in clinic after monitoring.  Signed, Malek Skog Jorja LoaMartin Parker Sawatzky, MD  11/24/2016, 8:03 AM

## 2016-11-24 NOTE — Progress Notes (Signed)
Discharged to home with family office visits in place teaching done  

## 2016-11-24 NOTE — Discharge Summary (Signed)
Discharge Summary    Patient ID: Karen Chafellie L XXXRichardson,  MRN: [id number], DOB/AGE: 21/04/1996 19 y.o.  Admit date: 11/22/2016 Discharge date: 11/24/2016  Primary Care Provider: No PCP Per Patient Primary Cardiologist: Dr. Nahser/ Dr. Elberta Fortisamnitz (EP)   Discharge Diagnoses    Active Problems:   Syncope   Allergies No Known Allergies   History of Present Illness     Karen Chafellie L XXXRichardson is a 20 y.o. female with no knowm PMHx, [facility] on 11/22/16 after several episodes of syncope.   She reported several syncopal episodes mainly when emotionally upset. 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  Had possibly palpitations prior to her episodes. She also reported sleeping poorly over the last few days.   She is adopted though after the death of her parents. She was told her mother died possibly of OD +/- a pneumonia. Her father died after his 7th MI in his 4440's and her sister died in infancy labeled SIDS.  She was admitted for further monitoring and work up.   Hospital Course     Consultants: EP  Syncope: she has remained in sinus rhythm on the monitor throughout her hospital stay. She did have an EKG from [facility] that had a wide right bundle and a right axis deviation. In calling [facility], they only have one EKG that shows that she was in sinus rhythm. Unfortunately, the copy of the right bundle EKG does not have her name written on it and is possible that this is on a different patient. That being said, we'll set her up for a 30 day monitor and follow her up in clinic after monitoring. Flecainide challenge to rule out Brugada syndrome was performed yesterday and negative. CMRI and 2D ECHO showed no structural or infiltrative heart disease. Urine pregnancy test and drug screen were negative. The patient is aware of [place] law, no driving for 6 months. 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  Lethargy: this has resolved. Head CT did not show any acute abnormality.    The patient has had an uncomplicated hospital course and is recovering well. She has been seen by Dr. Elberta Fortisamnitz today and deemed ready for discharge home. Discharge medications are listed below.  _____________  Discharge Vitals Blood pressure 111/64, pulse 66, temperature 97.9 F (36.6 C), temperature source Oral, resp. rate 18, weight 175 lb 14.4 oz (79.8 kg), SpO2 100 %.  Filed Weights   11/23/16 0600 11/24/16 0559  Weight: 177 lb 8 oz (80.5 kg) 175 lb 14.4 oz (79.8 kg)    Labs & Radiologic Studies     CBC  Recent Labs  11/22/16 1203 11/23/16 0417  WBC 9.5 9.0  HGB 12.8 13.7  HCT 37.9 40.6  MCV 86.3 87.7  PLT 259 272   Basic Metabolic Panel  Recent Labs  11/22/16 1203 11/23/16 0417  NA 138 139  K 3.4* 4.2  CL 105 109  CO2 26 22  GLUCOSE 93 102*  BUN 6 10  CREATININE 0.76 0.83  CALCIUM 9.2 9.1  MG 2.2  --    Liver Function Tests  Recent Labs  11/23/16 0417  AST 15  ALT 16  ALKPHOS 56  BILITOT 0.3  PROT 7.5  ALBUMIN 3.7   No  results for input(s): LIPASE, AMYLASE in the last 72 hours. Cardiac Enzymes No results for input(s): CKTOTAL, CKMB, CKMBINDEX, TROPONINI in the last 72 hours. BNP Invalid input(s): POCBNP D-Dimer No results for input(s): DDIMER in the last 72 hours. Hemoglobin A1C  Recent Labs  11/22/16 1407  HGBA1C 5.3   Fasting Lipid Panel  Recent Labs  11/23/16 0417  CHOL 137  HDL 40*  LDLCALC 83  TRIG 71  CHOLHDL 3.4   Thyroid Function Tests  Recent Labs  11/22/16 1407  TSH 0.954    Ct Head Wo Contrast  Result Date: 11/22/2016 CLINICAL DATA:  20 year old female with acute altered mental status dizziness and headache today. EXAM: CT HEAD WITHOUT CONTRAST TECHNIQUE: Contiguous axial images were obtained from  the base of the skull through the vertex without intravenous contrast. COMPARISON:  11/22/2016 FINDINGS: Brain: No intracranial abnormalities are identified, including mass lesion or mass effect, hydrocephalus, extra-axial fluid collection, midline shift, hemorrhage, or acute infarction. Vascular: No hyperdense vessel or unexpected calcification. Skull: Normal. Negative for fracture or focal lesion. Sinuses/Orbits: Small amount of fluid within the maxillary sinuses again noted. No other abnormalities identified. Other: None. IMPRESSION: No evidence of intracranial abnormality. Air-fluid levels within both maxillary sinuses which may represent acute sinusitis. Electronically Signed   By: Harmon Pier M.D.   On: 11/22/2016 15:47   Mr Cardiac Morphology W Wo Contrast  Result Date: 11/23/2016 CLINICAL DATA:  20 year old female with recurrent syncope and family history of SCD. EXAM: CARDIAC MRI TECHNIQUE: The patient was scanned on a 1.5 Tesla GE magnet. A dedicated cardiac coil was used. Functional imaging was done using Fiesta sequences. 2,3, and 4 chamber views were done to assess for RWMA's. Modified Simpson's rule using a short axis stack was used to calculate an ejection fraction on a dedicated work [employer]. The patient received 35 cc of Multihance. After 10 minutes inversion recovery sequences were used to assess for infiltration and scar tissue. CONTRAST:  35 cc  of Multihance FINDINGS: 1. Normal left ventricular size, thickness and systolic function (LVEF = 61%) with no regional wall motion abnormalities. LVEDD:  47 mm LVESD:  27 mm LVEDV:  114 ml LVESV:  44 ml SV:  70 ml CO:  4.7 L/min Myocardial mass:  108 g 2. Normal right ventricular size, thickness and systolic function (LVEF = 59%) with no regional wall motion abnormalities. 3.  Normal left and right atrial size. 4. Normal size of the aortic root, thoracic aorta and pulmonary artery. 5. No late gadolinium enhancement in the left  or right ventricular myocardium. 6. Normal pericardium. Trivial amount of inferiorly located pericardial effusion. IMPRESSION: 1. Normal left ventricular size, thickness and systolic function (LVEF = 61%) with no regional wall motion abnormalities. 2. Normal right ventricular size, thickness and systolic function (LVEF = 59%) with no regional wall motion abnormalities. 3.  Normal left and right atrial size. 4. Normal size of the aortic root, thoracic aorta and pulmonary artery. 5. No late gadolinium enhancement in the left or right ventricular myocardium. 6. Normal pericardium. Trivial amount of inferiorly located pericardial effusion. Collectively, there is no evidence for left ventricular infiltrative or inflammatory left ventricular cardiomyopathy or arrhythmogenic right ventricular cardiomyopathy. Tobias Alexander Electronically Signed   By: Tobias Alexander   On: 11/23/2016 16:37     Diagnostic Studies/Procedures    CMRI  11/23/16 IMPRESSION: 1. Normal left ventricular size, thickness and systolic function (LVEF = 61%) with no regional wall motion abnormalities.  2. Normal  right ventricular size, thickness and systolic function (LVEF = 59%) with no regional wall motion abnormalities.  3. Normal left and right atrial size.  4. Normal size of the aortic root, thoracic aorta and pulmonary artery.  5. No late gadolinium enhancement in the left or right ventricular myocardium.  6. Normal pericardium. Trivial amount of inferiorly located pericardial effusion.  Collectively, there is no evidence for left ventricular infiltrative or inflammatory left ventricular cardiomyopathy or arrhythmogenic right ventricular cardiomyopathy.  _____________  TTE 11/23/16 - Left ventricle: The cavity size was normal. Systolic function was normal. Wall motion was normal; there were no regional wall motion abnormalities. Left ventricular diastolic function parameters were  normal.  Disposition   Pt is being discharged home today in good condition.  Follow-up Plans & Appointments    Follow-up Information    Aerabella Galasso Jorja LoaMartin Elexis Pollak, MD Follow up.   Specialty:  Cardiology Why:  The office Charlotta Lapaglia call you to make an appointment in 6 weeks. They Charliegh Vasudevan also call you to set up a 30 day event monitor Contact information: [address] [place] [address] [phone]            Discharge Medications     Medication List    You have not been prescribed any medications.       Outstanding Labs/Studies   30 day event monitor.  Duration of Discharge Encounter   Greater than 30 minutes including physician time.  Signed, Cline CrockKathryn Thompson PA-C 11/24/2016, 11:44 AM   I have seen and examined this patient with Cline CrockKathryn Thompson.  Agree with above, note added to reflect my findings.  On exam, regular rhythm, no murmurs, lungs clear.  Presented with multiple episodes of syncope with intermittent RBBB on ECG. Had flecainide challenge to look for Brugada syndrome without evidence of abnormalities.  Has been in sinus rhythm on telemetry and has had CMRI without abnormality.  Gwen Edler plan for discharge today with plan for 30 day monitor placement. Have discussed no driving for 6 months.  Raahi Korber M. Richelle Glick MD 11/24/2016 12:46 PM

## 2016-11-26 ENCOUNTER — Emergency Department (HOSPITAL_COMMUNITY)
Admission: EM | Admit: 2016-11-26 | Discharge: 2016-11-27 | Disposition: A | Discharge status: Home / Self Care | Payer: Medicaid Other | Attending: Emergency Medicine | Admitting: Emergency Medicine

## 2016-11-26 DIAGNOSIS — F17210 Nicotine dependence, cigarettes, uncomplicated: Secondary | ICD-10-CM | POA: Insufficient documentation

## 2016-11-26 DIAGNOSIS — R55 Syncope and collapse: Secondary | ICD-10-CM | POA: Diagnosis present

## 2016-11-26 LAB — URINALYSIS, ROUTINE W REFLEX MICROSCOPIC
GLUCOSE, UA: NEGATIVE mg/dL
HGB URINE DIPSTICK: NEGATIVE
Ketones, ur: NEGATIVE mg/dL
Nitrite: NEGATIVE
PH: 5 (ref 5.0–8.0)
Protein, ur: NEGATIVE mg/dL
SPECIFIC GRAVITY, URINE: 1.029 (ref 1.005–1.030)

## 2016-11-26 LAB — CBC WITH DIFFERENTIAL/PLATELET
Basophils Absolute: 0 10*3/uL (ref 0.0–0.1)
Basophils Relative: 0 %
EOS PCT: 3 %
Eosinophils Absolute: 0.4 10*3/uL (ref 0.0–0.7)
HCT: 36.5 % (ref 36.0–46.0)
HEMOGLOBIN: 12.3 g/dL (ref 12.0–15.0)
LYMPHS ABS: 3.2 10*3/uL (ref 0.7–4.0)
LYMPHS PCT: 29 %
MCH: 29.3 pg (ref 26.0–34.0)
MCHC: 33.7 g/dL (ref 30.0–36.0)
MCV: 86.9 fL (ref 78.0–100.0)
Monocytes Absolute: 0.6 10*3/uL (ref 0.1–1.0)
Monocytes Relative: 6 %
Neutro Abs: 6.8 10*3/uL (ref 1.7–7.7)
Neutrophils Relative %: 62 %
PLATELETS: 280 10*3/uL (ref 150–400)
RBC: 4.2 MIL/uL (ref 3.87–5.11)
RDW: 13.1 % (ref 11.5–15.5)
WBC: 11 10*3/uL — ABNORMAL HIGH (ref 4.0–10.5)

## 2016-11-26 LAB — COMPREHENSIVE METABOLIC PANEL
ALBUMIN: 3.5 g/dL (ref 3.5–5.0)
ALT: 15 U/L (ref 14–54)
AST: 17 U/L (ref 15–41)
Alkaline Phosphatase: 52 U/L (ref 38–126)
Anion gap: 8 (ref 5–15)
BUN: 10 mg/dL (ref 6–20)
CHLORIDE: 106 mmol/L (ref 101–111)
CO2: 24 mmol/L (ref 22–32)
CREATININE: 0.73 mg/dL (ref 0.44–1.00)
Calcium: 8.7 mg/dL — ABNORMAL LOW (ref 8.9–10.3)
GFR calc non Af Amer: >60 mL/min (ref >60)
Glucose, Bld: 86 mg/dL (ref 65–99)
Potassium: 3.5 mmol/L (ref 3.5–5.1)
SODIUM: 138 mmol/L (ref 135–145)
Total Bilirubin: 0.2 mg/dL — ABNORMAL LOW (ref 0.3–1.2)
Total Protein: 6.4 g/dL — ABNORMAL LOW (ref 6.5–8.1)

## 2016-11-26 LAB — URINE MICROSCOPIC-ADD ON: RBC / HPF: NONE SEEN RBC/hpf (ref 0–5)

## 2016-11-26 LAB — LIPASE, BLOOD: Lipase: 35 U/L (ref 11–51)

## 2016-11-26 NOTE — Discharge Instructions (Signed)
As discussed, your evaluation today has been largely reassuring.  But, it is important that you monitor your condition carefully, and do not hesitate to return to the ED if you develop new, or concerning changes in your condition.  It is very important that you follow up tomorrow at the cardiology office.

## 2016-11-26 NOTE — ED Provider Notes (Addendum)
[facility]-EMERGENCY DEPT Provider Note   CSN: [id number] Arrival date & time: 11/26/16  1909     History   Chief Complaint Chief Complaint  Patient presents with  . Loss of Consciousness    HPI Shannon Galloway is a 20 y.o. female.  HPI Patient presents after an episode of syncope. Interestingly, the patient was discharged from this facility several days ago after an recent admission for several episodes of syncope. She notes that during that admission she had multiple evaluations performed, and was discharged to follow-up with cardiology today for consideration of Holter monitoring. She notes that today about 13 hours ago she had a non-prodromal episode of syncope, characteristically similar to those she experienced one week ago prior to her admission. Patient did not come to the emergency department as she had to go to a court appearance for her brother. She notes that in the interval 13 hours she had no additional episodes of syncope, nor chest pain. No recent medication changes, diet changes, activity changes. She does smoke.  Smoking cessation provided, particularly in light of this patient's evaluation in the ED.   Marland Kitchen.   Past Medical History:  Diagnosis Date  . Chronic headaches   . Neck pain   . Syncope    a. unclear etilogy. 10/2016: cardiac MRI and echo unremarkable.     Patient Active Problem List   Diagnosis Date Noted  . Syncope 11/22/2016  . Depression 12/22/2015  . Nexplanon insertion 06/10/2013  . General counseling and advice on female contraception 06/01/2013    Past Surgical History:  Procedure Laterality Date  . KNEE SURGERY Right 2011  . TONSILLECTOMY Bilateral     OB History    Gravida Para Term Preterm AB Living   0 0 0 0 0 0   SAB TAB Ectopic Multiple Live Births   0 0 0 0         Home Medications    Prior to Admission medications   Not on File    Family History Family History  Problem Relation Age of Onset  . Adopted: Yes    . Heart failure Father     Died at 7441  . Bipolar disorder Mother   . Pneumonia Mother     Died at 6839    Social History Social History  Substance Use Topics  . Smoking status: Current Every Day Smoker    Packs/day: 0.01    Years: 0.01    Types: Cigarettes  . Smokeless tobacco: Never Used  . Alcohol use No     Allergies   Patient has no known allergies.   Review of Systems Review of Systems  Constitutional:       Per HPI, otherwise negative  HENT:       Per HPI, otherwise negative  Respiratory:       Per HPI, otherwise negative  Cardiovascular:       Per HPI, otherwise negative  Gastrointestinal: Negative for vomiting.  Endocrine:       Negative aside from HPI  Genitourinary:       Neg aside from HPI   Musculoskeletal:       Per HPI, otherwise negative  Skin: Negative.   Neurological: Positive for syncope.     Physical Exam Updated Vital Signs BP 112/72 (BP Location: Right Arm) Comment: Simultaneous filing. User may not have seen previous data.  Pulse 89 Comment: Simultaneous filing. User may not have seen previous data.  Temp 97.8 F (36.6 C) (Oral)  Resp 23 Comment: Simultaneous filing. User may not have seen previous data.  Ht 5\' 2"  (1.575 m)   Wt 170 lb (77.1 kg)   SpO2 100% Comment: Simultaneous filing. User may not have seen previous data.  BMI 31.09 kg/m   Physical Exam  Constitutional: She is oriented to person, place, and time. She appears well-developed and well-nourished. No distress.  HENT:  Head: Normocephalic and atraumatic.  Eyes: Conjunctivae and EOM are normal.  Cardiovascular: Normal rate and regular rhythm.   Pulmonary/Chest: Effort normal and breath sounds normal. No stridor. No respiratory distress.  Abdominal: She exhibits no distension.  Musculoskeletal: She exhibits no edema.  Neurological: She is alert and oriented to person, place, and time. No cranial nerve deficit.  Skin: Skin is warm and dry.  Psychiatric: She has a  normal mood and affect.  Nursing note and vitals reviewed.    ED Treatments / Results  Labs (all labs ordered are listed, but only abnormal results are displayed) Labs Reviewed  CBC WITH DIFFERENTIAL/PLATELET - Abnormal; Notable for the following:       Result Value   WBC 11.0 (*)    All other components within normal limits  COMPREHENSIVE METABOLIC PANEL  ETHANOL  LIPASE, BLOOD  URINALYSIS, ROUTINE W REFLEX MICROSCOPIC (NOT AT [facility])  I-STAT BETA HCG BLOOD, ED ([facility], [facility], [facility] ONLY)    EKG  EKG Interpretation  Date/Time:  Monday November 26 2016 19:30:32 EST Ventricular Rate:  74 PR Interval:    QRS Duration: 97 QT Interval:  355 QTC Calculation: 394 R Axis:   65 Text Interpretation:  Sinus rhythm Consider left atrial enlargement RSR' in V1 or V2, right VCD or RVH No significant change since last tracing Borderline ECG Confirmed by Gerhard Munch  MD [phone]) on 11/26/2016 8:50:30 PM       Radiology No results found.  Procedures Procedures (including critical care time)  Medications Ordered in ED Medications - No data to display   Initial Impression / Assessment and Plan / ED Course  I have reviewed the triage vital signs and the nursing notes.  Pertinent labs & imaging results that were available during my care of the patient were reviewed by me and considered in my medical decision making (see chart for details).   EMR notable for recent admission with reassuring eval  CMRI  11/23/16 IMPRESSION: 1. Normal left ventricular size, thickness and systolic function (LVEF = 61%) with no regional wall motion abnormalities.   2. Normal right ventricular size, thickness and systolic function (LVEF = 59%) with no regional wall motion abnormalities.   3.  Normal left and right atrial size.   4. Normal size of the aortic root, thoracic aorta and pulmonary artery.   5. No late gadolinium enhancement in the left or right ventricular myocardium.   6. Normal  pericardium. Trivial amount of inferiorly located pericardial effusion.   Collectively, there is no evidence for left ventricular infiltrative or inflammatory left ventricular cardiomyopathy or arrhythmogenic right ventricular cardiomyopathy.   _____________   TTE 11/23/16 - Left ventricle: The cavity size was normal. Systolic function was   normal. Wall motion was normal; there were no regional wall   motion abnormalities. Left ventricular diastolic function   parameters were normal.   Clinical Course as of Nov 26 2332  Mon Nov 26, 2016  2124 Patient stated that she had another episode of LOC.  This was unwitnessed  [RL]    Clinical Course User Index [RL] Gerhard Munch, MD  Young female with recent hospitalization for syncope presents after another episode of syncope today, and after having missed her cardiology outpatient appointment. Here the patient is on a monitor for several hours, has no arrhythmia.  Labs unremarkable, EKG unchanged from recent study. Patient appropriate for discharge with next day cardiology follow-up.   Gerhard Munchobert Dave Mannes, MD 11/26/16 [id number]    Gerhard Munchobert Marybel Alcott, MD 12/11/16 [phone]

## 2016-11-26 NOTE — ED Triage Notes (Signed)
Pt. CO of chest pain that started today. SHe was seen here over recently for palpitations and was supposed to go for a haulter monitor today but missed the appointment. CP reproducible with palpation. Pt. Has a cough with productive cough.

## 2016-11-27 LAB — I-STAT BETA HCG BLOOD, ED (MC, WL, AP ONLY): I-stat hCG, quantitative: <5 m[IU]/mL

## 2016-11-28 ENCOUNTER — Ambulatory Visit (INDEPENDENT_AMBULATORY_CARE_PROVIDER_SITE_OTHER): Payer: Medicaid Other

## 2016-11-28 DIAGNOSIS — R55 Syncope and collapse: Secondary | ICD-10-CM

## 2016-12-18 ENCOUNTER — Ambulatory Visit: Payer: Medicaid Other | Admitting: Cardiology

## 2016-12-19 ENCOUNTER — Ambulatory Visit: Payer: Medicaid Other | Admitting: Cardiology

## 2017-01-10 ENCOUNTER — Ambulatory Visit: Payer: Medicaid Other | Admitting: Cardiology

## 2017-01-10 NOTE — Progress Notes (Deleted)
Electrophysiology Office Note   Date:  01/10/2017   ID:  Shannon Galloway, DOB 11/26/1996, MRN [id number]  PCP:  No PCP Per Patient  Primary Electrophysiologist:  Naasir Carreira Jorja LoaMartin Kyran Whittier, MD    No chief complaint on file.    History of Present Illness: Shannon Galloway is a 21 y.o. female who presents today for electrophysiology evaluation.   She an episode of syncope in November. Her [facility] showed a wide right bundle branch and right axis deviation. EKG was consistent with Brugada syndrome. She had an echo and an MRI which were without major abnormalities. Flecainide challenge to rule out Brugada syndrome. [facility] was called, and the only EKG that they had after the fact was an EKG of normal rhythm.It is possible that EKGs got mixed up and that the wrong one was sent to [facility].  She was discharged with an event monitor.   Today, she denies*** symptoms of palpitations, chest pain, shortness of breath, orthopnea, PND, lower extremity edema, claudication, dizziness, presyncope, syncope, bleeding, or neurologic sequela. The patient is tolerating medications without difficulties and is otherwise without complaint today.    Past Medical History:  Diagnosis Date  . Chronic headaches   . Neck pain   . Syncope    a. unclear etilogy. 10/2016: cardiac MRI and echo unremarkable.    Past Surgical History:  Procedure Laterality Date  . KNEE SURGERY Right 2011  . TONSILLECTOMY Bilateral      No current outpatient prescriptions on file.   No current facility-administered medications for this visit.     Allergies:   Patient has no known allergies.   Social History:  The patient  reports that she has been smoking Cigarettes.  She has a 0.00 pack-year smoking history. She has never used smokeless tobacco. She reports that she does not drink alcohol or use drugs.   Family History:  The patient's family history includes Bipolar disorder in her mother;  Heart failure in her father; Pneumonia in her mother. She was adopted.    ROS:  Please see the history of present illness.   Otherwise, review of systems is positive for ***.   All other systems are reviewed and negative.    PHYSICAL EXAM: VS:  There were no vitals taken for this visit. , BMI There is no height or weight on file to calculate BMI. GEN: Well nourished, well developed, in no acute distress  HEENT: normal  Neck: no JVD, carotid bruits, or masses Cardiac: ***RRR; no murmurs, rubs, or gallops,no edema  Respiratory:  clear to auscultation bilaterally, normal work of breathing GI: soft, nontender, nondistended, + BS MS: no deformity or atrophy  Skin: warm and dry Neuro:  Strength and sensation are intact Psych: euthymic mood, full affect  EKG:  EKG {ACTION; IS/IS [id number]}OT:[id number]} ordered today. Personal review of the ekg ordered shows ***  Recent Labs: 11/22/2016: Magnesium 2.2; TSH 0.954 11/26/2016: ALT 15; BUN 10; Creatinine, Ser 0.73; Hemoglobin 12.3; Platelets 280; Potassium 3.5; Sodium 138    Lipid Panel     Component Value Date/Time   CHOL 137 11/23/2016 0417   TRIG 71 11/23/2016 0417   HDL 40 (L) 11/23/2016 0417   CHOLHDL 3.4 11/23/2016 0417   VLDL 14 11/23/2016 0417   LDLCALC 83 11/23/2016 0417     Wt Readings from Last 3 Encounters:  11/26/16 170 lb (77.1 kg)  09/07/16 175 lb (79.4 kg)  12/22/15 209 lb (94.8 kg)  Other studies Reviewed: Additional studies/ records that were reviewed today include: CMRI  11/23/16, 30 day monitor  11/08/17 Review of the above records today demonstrates:  1. Normal left ventricular size, thickness and systolic function (LVEF = 61%) with no regional wall motion abnormalities.  2. Normal right ventricular size, thickness and systolic function (LVEF = 59%) with no regional wall motion abnormalities.  3.  Normal left and right atrial size.  4. Normal size of the aortic root, thoracic aorta and  pulmonary artery.  5. No late gadolinium enhancement in the left or right ventricular myocardium.  6. Normal pericardium. Trivial amount of inferiorly located pericardial effusion.  Collectively, there is no evidence for left ventricular infiltrative or inflammatory left ventricular cardiomyopathy or arrhythmogenic right ventricular cardiomyopathy.   The high average heart rate seen for the monitored period was 160 BPM. The low average heart rate seen for the monitored period was 50 BPM. Sinus rhythm with sinus tachycardia seen. No arrhythmia or pauses seen. No obvious cause for syncope.  ASSESSMENT AND PLAN:  1.  Syncope: unclear cause of syncope with no rhythm abnormalities on her 30 day monitor. The EKG that was sent to [facility] is possibly an EKG on another patient (showing right bundle left axis deviation and possible Brugada syndrome).     Current medicines are reviewed at length with the patient today.   The patient {ACTIONS; HAS/DOES NOT HAVE:19233} concerns regarding her medicines.  The following changes were made today:  {NONE DEFAULTED:18576::"none"}  Labs/ tests ordered today include: *** No orders of the defined types were placed in this encounter.    Disposition:   FU with *** {gen number 1-61:096045} {Days to years:10300}  Signed, Tineshia Becraft Jorja Loa, MD  01/10/2017 8:55 AM     [facility] [address] [place] [address] [phone] (office) [phone] (fax)

## 2017-01-11 ENCOUNTER — Telehealth: Payer: Self-pay | Admitting: Cardiology

## 2017-01-11 NOTE — Telephone Encounter (Signed)
New Message  Pt voiced wanting to speak with nurse.  Pt voiced she found out her aunt had lupus.  Pt rescheduled her appt also.  Please f/u

## 2017-01-11 NOTE — Telephone Encounter (Signed)
Reviewed monitor findings.  Pt already rescheduled f/u w/ Camnitz on 2/2. She verbalized understanding of monitor result and agreeable to plan.

## 2017-02-01 ENCOUNTER — Ambulatory Visit: Payer: Medicaid Other | Admitting: Cardiology

## 2017-02-01 NOTE — Progress Notes (Deleted)
Electrophysiology Office Note   Date:  02/01/2017   ID:  Shannon Galloway, DOB 1996/07/30, MRN [id number]  PCP:  No PCP Per Patient  Cardiologist:  *** Primary Electrophysiologist: *** Shriya Aker Jorja Loa, MD    No chief complaint on file.    History of Present Illness: Shannon Galloway is a 21 y.o. female who presents today for electrophysiology evaluation.   ***   Today, she denies*** symptoms of palpitations, chest pain, shortness of breath, orthopnea, PND, lower extremity edema, claudication, dizziness, presyncope, syncope, bleeding, or neurologic sequela. The patient is tolerating medications without difficulties and is otherwise without complaint today.    Past Medical History:  Diagnosis Date  . Chronic headaches   . Neck pain   . Syncope    a. unclear etilogy. 10/2016: cardiac MRI and echo unremarkable.    Past Surgical History:  Procedure Laterality Date  . KNEE SURGERY Right 2011  . TONSILLECTOMY Bilateral      No current outpatient prescriptions on file.   No current facility-administered medications for this visit.     Allergies:   Patient has no known allergies.   Social History:  The patient  reports that she has been smoking Cigarettes.  She has a 0.00 pack-year smoking history. She has never used smokeless tobacco. She reports that she does not drink alcohol or use drugs.   Family History:  The patient's ***family history includes Bipolar disorder in her mother; Heart failure in her father; Pneumonia in her mother. She was adopted.    ROS:  Please see the history of present illness.   Otherwise, review of systems is positive for ***.   All other systems are reviewed and negative.    PHYSICAL EXAM: VS:  There were no vitals taken for this visit. , BMI There is no height or weight on file to calculate BMI. GEN: Well nourished, well developed, in no acute distress HEENT: normal Neck: no JVD, carotid bruits, or masses Cardiac: ***RRR; no  murmurs, rubs, or gallops,no edema  Respiratory:  clear to auscultation bilaterally, normal work of breathing GI: soft, nontender, nondistended, + BS MS: no deformity or atrophy Skin: warm and dry, *** device pocket is well healed Neuro:  Strength and sensation are intact Psych: euthymic mood, full affect  EKG:  EKG {ACTION; IS/IS WUJ:[id number]} ordered today. Personal review of the ekg ordered shows ***  *** Device interrogation is reviewed today in detail.  See PaceArt for details.   Recent Labs: 11/22/2016: Magnesium 2.2; TSH 0.954 11/26/2016: ALT 15; BUN 10; Creatinine, Ser 0.73; Hemoglobin 12.3; Platelets 280; Potassium 3.5; Sodium 138    Lipid Panel     Component Value Date/Time   CHOL 137 11/23/2016 0417   TRIG 71 11/23/2016 0417   HDL 40 (L) 11/23/2016 0417   CHOLHDL 3.4 11/23/2016 0417   VLDL 14 11/23/2016 0417   LDLCALC 83 11/23/2016 0417     Wt Readings from Last 3 Encounters:  11/26/16 170 lb (77.1 kg)  09/07/16 175 lb (79.4 kg)  12/22/15 209 lb (94.8 kg)      Other studies Reviewed: Additional studies/ records that were reviewed today include: ***  Review of the above records today demonstrates: ***   ASSESSMENT AND PLAN:  1.  ***    Current medicines are reviewed at length with the patient today.   The patient {ACTIONS; HAS/DOES NOT HAVE:19233} concerns regarding her medicines.  The following changes were made today:  {NONE DEFAULTED:18576::"none"}  Labs/ tests ordered today  include: *** No orders of the defined types were placed in this encounter.    Disposition:   FU with *** {gen number [id number]} {Days to years:10300}  Signed, Korie Streat Jorja LoaMartin Thresia Ramanathan, MD  02/01/2017 8:30 AM     [facility] [address] [place] [address] [phone] (office) [phone] (fax)

## 2017-02-04 ENCOUNTER — Encounter: Payer: Self-pay | Admitting: Cardiology

## 2017-03-01 ENCOUNTER — Telehealth: Payer: Self-pay | Admitting: Cardiology

## 2017-03-01 NOTE — Telephone Encounter (Signed)
03-01-17 pt requested an emergency appt-called to schedule lmm @ 334pm for pt to call back-tried cell and someone answered and when I asked for pt they hung up-called back and got vm-mailbox full and couldn't leave a message.

## 2017-03-01 NOTE — Telephone Encounter (Signed)
F/U Call:  Patient called back and wanted to schedule an "emergency appt" with Dr. Elberta Fortisamnitz. I did verify,( due to number of no shows and cancellations) that I could schedule patient. Patient did not schedule the times or dates offered stated she would have to find a ride and call back to schedule.   Ms. 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 She also states that her heart rate was 124 last night and that she feels like her "issues with heart are getting worse." Patient states that both of her legs were swollen this week and purple.  Thanks.

## 2017-03-01 NOTE — Telephone Encounter (Signed)
New message   Pt is requesting an appointment today.

## 2017-03-05 ENCOUNTER — Encounter (INDEPENDENT_AMBULATORY_CARE_PROVIDER_SITE_OTHER): Payer: Self-pay

## 2017-03-05 ENCOUNTER — Encounter: Payer: Self-pay | Admitting: *Deleted

## 2017-03-05 ENCOUNTER — Encounter: Payer: Self-pay | Admitting: Cardiology

## 2017-03-05 ENCOUNTER — Ambulatory Visit (INDEPENDENT_AMBULATORY_CARE_PROVIDER_SITE_OTHER): Payer: Self-pay | Admitting: Cardiology

## 2017-03-05 VITALS — BP 122/80 | HR 80 | Ht 62.0 in | Wt 195.8 lb

## 2017-03-05 DIAGNOSIS — R55 Syncope and collapse: Secondary | ICD-10-CM

## 2017-03-05 NOTE — Progress Notes (Signed)
Electrophysiology Office Note   Date:  03/05/2017   ID:  AUTYMN OMLOR, DOB 01-07-1996, MRN [id number]  PCP:  No PCP Per Patient Primary Electrophysiologist:  Lex Linhares Jorja Loa, MD    Chief Complaint  Patient presents with  . Follow-up  . Chest Pain  . Edema    legs and feet  . Shortness of Breath  . Tachycardia     History of Present Illness: Shannon Galloway is a 21 y.o. female who presents today for electrophysiology evaluation.   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 She had a significantly abnormal EKG which was sent over by an outside hospital, but her EKG [facility] as well as her rhythm on telemetry were found to be normal. She underwent cardiac MRI that showed no evidence of infiltrative disease, and had a flecainide challenge that showed no evidence of Brugada syndrome. She wore a 30 day monitor that showed no evidence of any arrhythmia that could cause her episodes of syncope.   Today, she denies symptoms of claudication, dizziness, presyncope, bleeding, or neurologic sequela. The patient is tolerating medications without difficulties and is otherwise without complaint today.    Past Medical History:  Diagnosis Date  . Chronic headaches   . Neck pain   . Syncope    a. unclear etilogy. 10/2016: cardiac MRI and echo unremarkable.    Past Surgical History:  Procedure Laterality Date  . KNEE SURGERY Right 2011  . TONSILLECTOMY Bilateral      No current outpatient prescriptions on file.   No current facility-administered medications for this visit.     Allergies:   Patient has no known allergies.   Social History:  The patient  reports that she has been smoking Cigarettes.  She has a 0.00 pack-year smoking history. She has never used smokeless tobacco. She reports that she does not drink alcohol or use drugs.   Family History:  The patient's family history includes Bipolar disorder in her mother;  Heart failure in her father; Pneumonia in her mother. She was adopted.    ROS:  Please see the history of present illness.   Otherwise, review of systems is positive for Weight change, appetite change, chills, sweats, fatigue, chest pain, shortness of breath, palpitations, cough, shortness of breath at night, snoring, depression, anxiety, joint swelling, rash, dizziness, syncope, headaches.   All other systems are reviewed and negative.    PHYSICAL EXAM: VS:  BP 122/80   Pulse 80   Ht 5\' 2"  (1.575 m)   Wt 195 lb 12.8 oz (88.8 kg)   SpO2 97%   BMI 35.81 kg/m  , BMI Body mass index is 35.81 kg/m. GEN: Well nourished, well developed, in no acute distress  HEENT: normal  Neck: no JVD, carotid bruits, or masses Cardiac: RRR; no murmurs, rubs, or gallops,no edema  Respiratory:  clear to auscultation bilaterally, normal work of breathing GI: soft, nontender, nondistended, + BS MS: no deformity or atrophy  Skin: warm and dry Neuro:  Strength and sensation are intact Psych: euthymic mood, full affect  EKG:  EKG is not ordered today. Personal review of the ekg ordered 11/26/17 shows sinus rhythm, rte 74  Recent Labs: 11/22/2016: Magnesium 2.2; TSH 0.954 11/26/2016: ALT 15; BUN 10; Creatinine, Ser 0.73; Hemoglobin 12.3; Platelets 280; Potassium 3.5; Sodium 138    Lipid Panel     Component Value Date/Time   CHOL 137 11/23/2016 0417   TRIG 71 11/23/2016  0417   HDL 40 (L) 11/23/2016 0417   CHOLHDL 3.4 11/23/2016 0417   VLDL 14 11/23/2016 0417   LDLCALC 83 11/23/2016 0417     Wt Readings from Last 3 Encounters:  03/05/17 195 lb 12.8 oz (88.8 kg)  11/26/16 170 lb (77.1 kg)  09/07/16 175 lb (79.4 kg)      Other studies Reviewed: Additional studies/ records that were reviewed today include: Telmetry monitor 01/08/17, CMRI 11/23/16 Review of the above records today demonstrates:  The high average heart rate seen for the monitored period was 160 BPM. The low average heart rate seen  for the monitored period was 50 BPM. Sinus rhythm with sinus tachycardia seen. No arrhythmia or pauses seen. No obvious cause for syncope.  1. Normal left ventricular size, thickness and systolic function (LVEF = 61%) with no regional wall motion abnormalities.  2. Normal right ventricular size, thickness and systolic function (LVEF = 59%) with no regional wall motion abnormalities.  3.  Normal left and right atrial size.  4. Normal size of the aortic root, thoracic aorta and pulmonary artery.  5. No late gadolinium enhancement in the left or right ventricular myocardium.  6. Normal pericardium. Trivial amount of inferiorly located pericardial effusion.   ASSESSMENT AND PLAN:  1.  Syncope: Had an exhaustive workup for her episode of syncope. Her abnormal EKG could have been on a separate patient which was sent over by an outside hospital. Her EKGs have all been stable since that time. Flecainide challenge was negative for Brugada syndrome, and her cardiac MRI did not show any evidence of infiltrative disease. She wore telemetry that showed no evidence of arrhythmia for 30 days. Time, it is unclear as to the cause of her episode of syncope. It could possibly be noncardiac in nature. I have instructed her to continue to avoid driving for 6 months from her episode of syncope, which was in November. Continued to have episodes where she has passed out. She says that she has dizziness when she stands up as well as potentially fast heart rates. It is possible that she has pots. I have told her to increase her fluid intake. At this time, without any definitive cardiac diagnosis, we'll not start any medications. We have told her not to drive due to her episodes of syncope for 6 months since her most recent episode. Warnell Rasnic check a TSH today to rule out thyroid disease.    Current medicines are reviewed at length with the patient today.   The patient does not have concerns regarding her  medicines.  The following changes were made today:  none  Labs/ tests ordered today include:  Orders Placed This Encounter  Procedures  . TSH     Disposition:   FU with Idaliz Tinkle 3 months  Signed, Carlina Derks Jorja LoaMartin Ariyon Mittleman, MD  03/05/2017 12:09 PM     [facility] [address] [place] [address] [phone] (office) [phone] (fax)

## 2017-03-05 NOTE — Patient Instructions (Addendum)
Medication Instructions:    Your physician recommends that you continue on your current medications as directed. Please refer to the Current Medication list given to you today.  --- If you need a refill on your cardiac medications before your next appointment, please call your pharmacy. ---  Labwork:  TSH today  Testing/Procedures:  None ordered  Follow-Up:  Your physician recommends that you schedule a follow-up appointment in: 3 months with Dr. Elberta Fortisamnitz.  Thank you for choosing [facility]!!   Dory HornSherri Irisa Grimsley, RN [phone]   Any Other Special Instructions Will Be Listed Below (If Applicable).  You may not drive for 6 months after a syncopal episode (passing out episode)

## 2017-03-06 LAB — TSH: TSH: 1.66 u[IU]/mL (ref 0.450–4.500)

## 2017-03-12 ENCOUNTER — Encounter: Payer: Self-pay | Admitting: *Deleted

## 2017-03-26 ENCOUNTER — Telehealth: Payer: Self-pay | Admitting: Cardiology

## 2017-03-26 NOTE — Telephone Encounter (Signed)
Informed pt of normal result. She verbalized understanding.

## 2017-03-26 NOTE — Telephone Encounter (Signed)
Follow Up:   Pt would like her lab results from 03-05-17 please.

## 2017-03-26 NOTE — Telephone Encounter (Signed)
Follow up    Pt called back 2nd time got disconnect, would like her test results today if possible

## 2017-06-04 ENCOUNTER — Emergency Department
Admission: EM | Admit: 2017-06-04 | Discharge: 2017-06-05 | Disposition: A | Discharge status: Other Acute Inpatient Hospital | Payer: BC Managed Care – PPO | Attending: Emergency Medicine | Admitting: Emergency Medicine

## 2017-06-04 DIAGNOSIS — F1220 Cannabis dependence, uncomplicated: Secondary | ICD-10-CM

## 2017-06-04 DIAGNOSIS — R45851 Suicidal ideations: Secondary | ICD-10-CM

## 2017-06-04 DIAGNOSIS — F329 Major depressive disorder, single episode, unspecified: Secondary | ICD-10-CM | POA: Insufficient documentation

## 2017-06-04 DIAGNOSIS — F3181 Bipolar II disorder: Secondary | ICD-10-CM

## 2017-06-04 DIAGNOSIS — X838XXA Intentional self-harm by other specified means, initial encounter: Secondary | ICD-10-CM | POA: Diagnosis not present

## 2017-06-04 DIAGNOSIS — F1410 Cocaine abuse, uncomplicated: Secondary | ICD-10-CM | POA: Insufficient documentation

## 2017-06-04 DIAGNOSIS — Y9389 Activity, other specified: Secondary | ICD-10-CM | POA: Diagnosis not present

## 2017-06-04 DIAGNOSIS — R55 Syncope and collapse: Secondary | ICD-10-CM | POA: Diagnosis not present

## 2017-06-04 DIAGNOSIS — Y929 Unspecified place or not applicable: Secondary | ICD-10-CM | POA: Insufficient documentation

## 2017-06-04 DIAGNOSIS — F17210 Nicotine dependence, cigarettes, uncomplicated: Secondary | ICD-10-CM | POA: Diagnosis not present

## 2017-06-04 DIAGNOSIS — F1210 Cannabis abuse, uncomplicated: Secondary | ICD-10-CM | POA: Diagnosis not present

## 2017-06-04 DIAGNOSIS — F1310 Sedative, hypnotic or anxiolytic abuse, uncomplicated: Secondary | ICD-10-CM

## 2017-06-04 DIAGNOSIS — F32A Depression, unspecified: Secondary | ICD-10-CM

## 2017-06-04 DIAGNOSIS — F4310 Post-traumatic stress disorder, unspecified: Secondary | ICD-10-CM

## 2017-06-04 DIAGNOSIS — T71162A Asphyxiation due to hanging, intentional self-harm, initial encounter: Secondary | ICD-10-CM | POA: Insufficient documentation

## 2017-06-04 DIAGNOSIS — M542 Cervicalgia: Secondary | ICD-10-CM | POA: Insufficient documentation

## 2017-06-04 DIAGNOSIS — F1520 Other stimulant dependence, uncomplicated: Secondary | ICD-10-CM

## 2017-06-04 DIAGNOSIS — F1420 Cocaine dependence, uncomplicated: Secondary | ICD-10-CM

## 2017-06-04 HISTORY — DX: Reserved for concepts with insufficient information to code with codable children: IMO0002

## 2017-06-04 HISTORY — DX: Systemic lupus erythematosus, unspecified: M32.9

## 2017-06-04 LAB — COMPREHENSIVE METABOLIC PANEL
ALBUMIN: 4.3 g/dL (ref 3.5–5.0)
ALT: 15 U/L (ref 14–54)
ANION GAP: 8 (ref 5–15)
AST: 20 U/L (ref 15–41)
Alkaline Phosphatase: 57 U/L (ref 38–126)
BUN: 13 mg/dL (ref 6–20)
CHLORIDE: 110 mmol/L (ref 101–111)
CO2: 23 mmol/L (ref 22–32)
Calcium: 9.4 mg/dL (ref 8.9–10.3)
Creatinine, Ser: 0.85 mg/dL (ref 0.44–1.00)
GFR calc Af Amer: >60 mL/min (ref >60)
GFR calc non Af Amer: >60 mL/min (ref >60)
GLUCOSE: 101 mg/dL — AB (ref 65–99)
POTASSIUM: 3.9 mmol/L (ref 3.5–5.1)
SODIUM: 141 mmol/L (ref 135–145)
TOTAL PROTEIN: 8.1 g/dL (ref 6.5–8.1)
Total Bilirubin: 0.4 mg/dL (ref 0.3–1.2)

## 2017-06-04 LAB — CBC
HEMATOCRIT: 41.3 % (ref 35.0–47.0)
HEMOGLOBIN: 14 g/dL (ref 12.0–16.0)
MCH: 29.1 pg (ref 26.0–34.0)
MCHC: 33.8 g/dL (ref 32.0–36.0)
MCV: 85.9 fL (ref 80.0–100.0)
Platelets: 278 10*3/uL (ref 150–440)
RBC: 4.81 MIL/uL (ref 3.80–5.20)
RDW: 13.5 % (ref 11.5–14.5)
WBC: 12.5 10*3/uL — ABNORMAL HIGH (ref 3.6–11.0)

## 2017-06-04 LAB — ETHANOL: Alcohol, Ethyl (B): <5 mg/dL (ref <5)

## 2017-06-04 LAB — ACETAMINOPHEN LEVEL

## 2017-06-04 LAB — POCT PREGNANCY, URINE: Preg Test, Ur: NEGATIVE

## 2017-06-04 LAB — SALICYLATE LEVEL: Salicylate Lvl: <7 mg/dL (ref 2.8–30.0)

## 2017-06-04 NOTE — ED Provider Notes (Signed)
[facility] Emergency Department Provider Note   ____________________________________________   First MD Initiated Contact with Patient 06/04/17 2347     (approximate)  I have reviewed the triage vital signs and the nursing notes.   HISTORY  Chief Complaint Suicidal    HPI Shannon Galloway is a 21 y.o. female brought to the ED by [facility] deputy under IVC for suicide attempt.Patient has a history of depression with prior suicide attempts. Try to slit her wrists with a butter knife earlier in the day. Then she tied several aprons and hung herself from a tree. She thinks she passed out because she came to on the ground. She punched the tree in anger and ran to a neighbor's house to call the police. Complains of pain to her neck and right hand. Denies HI/AH/VH. Denies chest pain, shortness of breath, abdominal pain, nausea, vomiting. Notes that she has lupus and has missed recent treatment secondary to homelessness. Does not take any antidepressants currently.   Past Medical History:  Diagnosis Date  . Chronic headaches   . Lupus   . Neck pain   . Syncope    a. unclear etilogy. 10/2016: cardiac MRI and echo unremarkable.     Patient Active Problem List   Diagnosis Date Noted  . Syncope 11/22/2016  . Depression 12/22/2015  . Nexplanon insertion 06/10/2013  . General counseling and advice on female contraception 06/01/2013    Past Surgical History:  Procedure Laterality Date  . KNEE SURGERY Right 2011  . TONSILLECTOMY Bilateral     Prior to Admission medications   Not on File    Allergies Patient has no known allergies.  Family History  Problem Relation Age of Onset  . Adopted: Yes  . Heart failure Father        Died at 84  . Bipolar disorder Mother   . Pneumonia Mother        Died at 52    Social History Social History  Substance Use Topics  . Smoking status: Current Every Day Smoker    Packs/day: 0.01    Years: 0.01   Types: Cigarettes  . Smokeless tobacco: Never Used  . Alcohol use Yes     Comment: socially    Review of Systems  Constitutional: No fever/chills. Eyes: No visual changes. ENT: Positive for neck pain. No sore throat. Cardiovascular: Denies chest pain. Respiratory: Denies shortness of breath. Gastrointestinal: No abdominal pain.  No nausea, no vomiting.  No diarrhea.  No constipation. Genitourinary: Negative for dysuria. Musculoskeletal: Positive for right hand pain. Negative for back pain. Skin: Negative for rash. Neurological: Negative for headaches, focal weakness or numbness. Psychiatric:Positive for depression with suicide attempt.  ____________________________________________   PHYSICAL EXAM:  VITAL SIGNS: ED Triage Vitals  Enc Vitals Group     BP 06/04/17 2251 122/84     Pulse Rate 06/04/17 2251 (!) 118     Resp 06/04/17 2251 (!) 24     Temp 06/04/17 2251 98.6 F (37 C)     Temp Source 06/04/17 2251 Oral     SpO2 06/04/17 2251 97 %     Weight 06/04/17 2253 175 lb (79.4 kg)     Height 06/04/17 2253 5\' 2"  (1.575 m)     Head Circumference --      Peak Flow --      Pain Score 06/04/17 2251 8     Pain Loc --      Pain Edu? --  Excl. in GC? --     Constitutional: Alert and oriented. Well appearing and in no acute distress. Eyes: Conjunctivae are normal. PERRL. EOMI. Head: Atraumatic. Nose: No congestion/rhinnorhea. Mouth/Throat: There is no tongue swelling or angioedema. Mucous membranes are moist.  Oropharynx non-erythematous. There is no hoarse or muffled voice. There is no drooling. Neck: No stridor.  Ligature marks noted to anterior neck. There is no neck mass or hematoma. Midline cervical spine mildly tender to palpation. No carotid bruits. Cardiovascular: Normal rate, regular rhythm. Grossly normal heart sounds.  Good peripheral circulation. Respiratory: Normal respiratory effort.  No retractions. Lungs CTAB. Gastrointestinal: Soft and nontender. No  distention. No abdominal bruits. No CVA tenderness. Musculoskeletal:  Right dorsal hand tender to palpation without swelling or deformity. Full range of motion without pain. 2+ radial pulse. Brisk, less than 5 second capillary refill. Very superficial abrasions noted to bilateral inner forearms and wrists. Neurologic:  Normal speech and language. No gross focal neurologic deficits are appreciated. No gait instability. Skin:  Skin is warm, dry and intact. No rash noted. Psychiatric: Mood and affect are flat and tearful. Speech and behavior are flat.  ____________________________________________   LABS (all labs ordered are listed, but only abnormal results are displayed)  Labs Reviewed  COMPREHENSIVE METABOLIC PANEL - Abnormal; Notable for the following:       Result Value   Glucose, Bld 101 (*)    All other components within normal limits  ACETAMINOPHEN LEVEL - Abnormal; Notable for the following:    Acetaminophen (Tylenol), Serum <10 (*)    All other components within normal limits  CBC - Abnormal; Notable for the following:    WBC 12.5 (*)    All other components within normal limits  URINE DRUG SCREEN, QUALITATIVE ([facility] ONLY) - Abnormal; Notable for the following:    Amphetamines, Ur Screen POSITIVE (*)    Cocaine Metabolite,Ur [place] POSITIVE (*)    Cannabinoid 50 Ng, Ur [place] POSITIVE (*)    Benzodiazepine, Ur Scrn POSITIVE (*)    All other components within normal limits  ETHANOL  SALICYLATE LEVEL  POC URINE PREG, ED  POCT PREGNANCY, URINE   ____________________________________________  EKG  None ____________________________________________  RADIOLOGY  Ct Head Wo Contrast  Result Date: 06/05/2017 CLINICAL DATA:  Neck pain, question loss of consciousness after hanging. EXAM: CT HEAD WITHOUT CONTRAST CT CERVICAL SPINE WITHOUT CONTRAST TECHNIQUE: Multidetector CT imaging of the head and cervical spine was performed following the standard protocol without intravenous contrast.  Multiplanar CT image reconstructions of the cervical spine were also generated. COMPARISON:  None. FINDINGS: CT HEAD FINDINGS BRAIN: The ventricles and sulci are normal. No intraparenchymal hemorrhage, mass effect nor midline shift. No acute large vascular territory infarcts. No abnormal extra-axial fluid collections. Basal cisterns are midline and not effaced. No acute cerebellar abnormality. VASCULAR: Unremarkable. SKULL/SOFT TISSUES: No skull fracture. No significant soft tissue swelling. ORBITS/SINUSES: The included ocular globes and orbital contents are normal.The mastoid air-cells and included paranasal sinuses are well-aerated. OTHER: None. CT CERVICAL SPINE FINDINGS ALIGNMENT: Vertebral bodies in alignment. Maintained lordosis. 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 Cervical vertebral bodies and posterior elements are intact. Intervertebral disc heights preserved. No destructive bony lesions. C1-2 articulation maintained. SOFT TISSUES AND SPINAL CANAL: Normal. DISC LEVELS: No significant osseous canal stenosis or neural foraminal narrowing. UPPER CHEST: Lung apices are clear. OTHER: None. IMPRESSION: 1. No acute intracranial abnormality. 2. No acute posttraumatic cervical spine fracture or subluxation Electronically Signed  By: Tollie Eth M.D.   On: 06/05/2017 00:43   Ct Cervical Spine Wo Contrast  Result Date: 06/05/2017 CLINICAL DATA:  Neck pain, question loss of consciousness after hanging. EXAM: CT HEAD WITHOUT CONTRAST CT CERVICAL SPINE WITHOUT CONTRAST TECHNIQUE: Multidetector CT imaging of the head and cervical spine was performed following the standard protocol without intravenous contrast. Multiplanar CT image reconstructions of the cervical spine were also generated. COMPARISON:  None. FINDINGS: CT HEAD FINDINGS BRAIN: The ventricles and sulci are normal. No intraparenchymal hemorrhage, mass effect nor midline shift. No acute large  vascular territory infarcts. No abnormal extra-axial fluid collections. Basal cisterns are midline and not effaced. No acute cerebellar abnormality. VASCULAR: Unremarkable. SKULL/SOFT TISSUES: No skull fracture. No significant soft tissue swelling. ORBITS/SINUSES: The included ocular globes and orbital contents are normal.The mastoid air-cells and included paranasal sinuses are well-aerated. OTHER: None. CT CERVICAL SPINE FINDINGS ALIGNMENT: Vertebral bodies in alignment. Maintained lordosis. 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 Cervical vertebral bodies and posterior elements are intact. Intervertebral disc heights preserved. No destructive bony lesions. C1-2 articulation maintained. SOFT TISSUES AND SPINAL CANAL: Normal. DISC LEVELS: No significant osseous canal stenosis or neural foraminal narrowing. UPPER CHEST: Lung apices are clear. OTHER: None. IMPRESSION: 1. No acute intracranial abnormality. 2. No acute posttraumatic cervical spine fracture or subluxation Electronically Signed   By: Tollie Eth M.D.   On: 06/05/2017 00:43   Dg Hand Complete Right  Result Date: 06/05/2017 CLINICAL DATA:  Patient punched a tree.  Hand pain. EXAM: RIGHT HAND - COMPLETE 3+ VIEW COMPARISON:  None. FINDINGS: There is no evidence of fracture or dislocation. There is no evidence of arthropathy or other focal bone abnormality. Mild carpal bossing at the base of the metacarpals on the lateral view. Soft tissues are unremarkable. IMPRESSION: Negative for acute fracture or malalignment. Slight bony prominence at the base of the metacarpals dorsally consistent with carpal bossing. This can be a source of mechanical irritation on the overlying extensor muscle and tendons. Electronically Signed   By: Tollie Eth M.D.   On: 06/05/2017 00:40    ____________________________________________   PROCEDURES  Procedure(s) performed: None  Procedures  Critical Care performed:  No  ____________________________________________   INITIAL IMPRESSION / ASSESSMENT AND PLAN / ED COURSE  Pertinent labs & imaging results that were available during my care of the patient were reviewed by me and considered in my medical decision making (see chart for details).  21 year old female who presents under IVC for suicide attempt. Ligature marks noted on clinical exam. Will obtain CT cervical spine as well as head to evaluate for cerebral edema. X-rays of right hand to evaluate for fracture. Screening toxicological lab work and urinalysis positive for amphetamines, cocaine, cannabinoids and benzodiazepines. Patient will remain under IVC in the emergency department pending TTS and psychiatry consults.  Clinical Course as of Jun 06 555  Wed Jun 05, 2017  0011 Salicylate Lvl: <7.0 [JS]  1610 Patient was medically cleared after negative imaging studies and transferred to [facility] awaiting psychiatry evaluation this morning.  [JS]    Clinical Course User Index [JS] Irean Hong, MD     ____________________________________________   FINAL CLINICAL IMPRESSION(S) / ED DIAGNOSES  Final diagnoses:  Suicidal thoughts  Depression, unspecified depression type  Cocaine abuse  Marijuana abuse  Benzodiazepine abuse      NEW MEDICATIONS STARTED DURING THIS VISIT:  New Prescriptions   No medications on file  Note:  This document was prepared using Dragon voice recognition software and may include unintentional dictation errors.    Irean HongSung, Zionah Criswell J, MD 06/05/17 [phone]

## 2017-06-04 NOTE — ED Triage Notes (Signed)
[place] [facility] [employer] [place] with IVC paperwork pending.  Pt tried to slit her wrists earlier in the day, but states that the knife was not sharp enough.  Pt later tried to hang herself, but passed out and when she came to, she ran to a neighbor's house and called the police.  Pt states "I just can't do it anymore, I'm not crazy."  Pt states that she is currently homeless.  Pt also states that she has lupus and has missed her recent treatments.  Pt tearful in triage.

## 2017-06-04 NOTE — ED Notes (Signed)
Dressed pt out in purple scrubs, belongings bagged and locked.  Belongings include 1 pair white sandals, 2 rings, 1 earring, sports bra, shirt and legging pants with large holes in legs.  Pt walked to room 21 with sherrif escort.

## 2017-06-05 ENCOUNTER — Encounter: Payer: Self-pay | Admitting: *Deleted

## 2017-06-05 ENCOUNTER — Emergency Department: Payer: BC Managed Care – PPO

## 2017-06-05 ENCOUNTER — Inpatient Hospital Stay
Admission: AD | Admit: 2017-06-05 | Discharge: 2017-06-08 | DRG: 885 | Disposition: A | Discharge status: Home / Self Care | Payer: No Typology Code available for payment source | Source: Intra-hospital | Attending: Psychiatry | Admitting: Psychiatry

## 2017-06-05 DIAGNOSIS — R4587 Impulsiveness: Secondary | ICD-10-CM | POA: Diagnosis present

## 2017-06-05 DIAGNOSIS — F4310 Post-traumatic stress disorder, unspecified: Secondary | ICD-10-CM | POA: Diagnosis present

## 2017-06-05 DIAGNOSIS — Z590 Homelessness: Secondary | ICD-10-CM | POA: Diagnosis not present

## 2017-06-05 DIAGNOSIS — F1420 Cocaine dependence, uncomplicated: Secondary | ICD-10-CM | POA: Diagnosis present

## 2017-06-05 DIAGNOSIS — Z915 Personal history of self-harm: Secondary | ICD-10-CM

## 2017-06-05 DIAGNOSIS — R45851 Suicidal ideations: Secondary | ICD-10-CM | POA: Diagnosis present

## 2017-06-05 DIAGNOSIS — F419 Anxiety disorder, unspecified: Secondary | ICD-10-CM | POA: Diagnosis present

## 2017-06-05 DIAGNOSIS — F332 Major depressive disorder, recurrent severe without psychotic features: Secondary | ICD-10-CM

## 2017-06-05 DIAGNOSIS — F3181 Bipolar II disorder: Principal | ICD-10-CM | POA: Diagnosis present

## 2017-06-05 DIAGNOSIS — M539 Dorsopathy, unspecified: Secondary | ICD-10-CM | POA: Diagnosis present

## 2017-06-05 DIAGNOSIS — F1320 Sedative, hypnotic or anxiolytic dependence, uncomplicated: Secondary | ICD-10-CM | POA: Diagnosis present

## 2017-06-05 DIAGNOSIS — F17210 Nicotine dependence, cigarettes, uncomplicated: Secondary | ICD-10-CM | POA: Diagnosis present

## 2017-06-05 DIAGNOSIS — F1220 Cannabis dependence, uncomplicated: Secondary | ICD-10-CM | POA: Diagnosis present

## 2017-06-05 DIAGNOSIS — F1520 Other stimulant dependence, uncomplicated: Secondary | ICD-10-CM | POA: Diagnosis present

## 2017-06-05 DIAGNOSIS — F909 Attention-deficit hyperactivity disorder, unspecified type: Secondary | ICD-10-CM | POA: Diagnosis present

## 2017-06-05 DIAGNOSIS — T71162A Asphyxiation due to hanging, intentional self-harm, initial encounter: Secondary | ICD-10-CM | POA: Diagnosis present

## 2017-06-05 DIAGNOSIS — G4700 Insomnia, unspecified: Secondary | ICD-10-CM | POA: Diagnosis present

## 2017-06-05 DIAGNOSIS — F17200 Nicotine dependence, unspecified, uncomplicated: Secondary | ICD-10-CM | POA: Diagnosis present

## 2017-06-05 DIAGNOSIS — F329 Major depressive disorder, single episode, unspecified: Secondary | ICD-10-CM | POA: Diagnosis not present

## 2017-06-05 LAB — URINE DRUG SCREEN, QUALITATIVE (ARMC ONLY)
AMPHETAMINES, UR SCREEN: POSITIVE — AB
Barbiturates, Ur Screen: NOT DETECTED
Benzodiazepine, Ur Scrn: POSITIVE — AB
COCAINE METABOLITE, UR ~~LOC~~: POSITIVE — AB
Cannabinoid 50 Ng, Ur ~~LOC~~: POSITIVE — AB
MDMA (ECSTASY) UR SCREEN: NOT DETECTED
METHADONE SCREEN, URINE: NOT DETECTED
Opiate, Ur Screen: NOT DETECTED
Phencyclidine (PCP) Ur S: NOT DETECTED
TRICYCLIC, UR SCREEN: NOT DETECTED

## 2017-06-05 MED ORDER — NICOTINE 21 MG/24HR TD PT24
21.0000 mg | MEDICATED_PATCH | Freq: Every day | TRANSDERMAL | Status: DC
  Filled 2017-06-05 (×2): qty 1

## 2017-06-05 MED ORDER — ACETAMINOPHEN 325 MG PO TABS
650.0000 mg | ORAL_TABLET | Freq: Four times a day (QID) | ORAL | Status: DC | PRN
  Administered 2017-06-05: 650 mg via ORAL
  Filled 2017-06-05: qty 2

## 2017-06-05 MED ORDER — MAGNESIUM HYDROXIDE 400 MG/5ML PO SUSP: 30.0000 mL | Freq: Every day | ORAL | Status: DC | PRN

## 2017-06-05 MED ORDER — IBUPROFEN 600 MG PO TABS
600.0000 mg | ORAL_TABLET | Freq: Four times a day (QID) | ORAL | Status: DC | PRN
  Administered 2017-06-05: 600 mg via ORAL
  Filled 2017-06-05: qty 1

## 2017-06-05 MED ORDER — ALUM & MAG HYDROXIDE-SIMETH 200-200-20 MG/5ML PO SUSP: 30.0000 mL | ORAL | Status: DC | PRN

## 2017-06-05 MED ORDER — QUETIAPINE FUMARATE 100 MG PO TABS
100.0000 mg | ORAL_TABLET | Freq: Every day | ORAL | Status: DC
  Administered 2017-06-05: 100 mg via ORAL

## 2017-06-05 NOTE — [facility] Group Notes (Signed)
[facility] Group Notes:  (Nursing/MHT/Case Management/Adjunct)  Date:  06/05/2017  Time:  11:40 PM  Type of Therapy:  Group Therapy  Participation Level:  Active  Participation Quality:  Appropriate  Affect:  Appropriate  Cognitive:  Appropriate  Insight:  Appropriate  Engagement in Group:  Engaged  Modes of Intervention:  Discussion  Summary of Progress/Problems:  Burt EkJanice Marie Miasha Emmons 06/05/2017, 11:40 PM

## 2017-06-05 NOTE — ED Notes (Signed)
Patient states that her neck is still sore, Patient is calm, and appears slightly more positive after talking with her cousin, Patient ask if she could use the phone when [place] gets [place]. Nurse did answer [facility]. q 15 minute checks.

## 2017-06-05 NOTE — Progress Notes (Signed)
Shannon Galloway is a 21 year old female that presented to the ED by law enforcement. Patient attempted suicide by way of hanging herself with a clothes wire from a tree. Patient verbalized feeling helpless and lonely in the world since she lost her parents during her teenage years. Patient states, "I don't have anyone in the world that cares for me, they all have basically gotten tired of me doing this." Patient has a past medical history of Lupus, chronic headaches and syncope. Present with a flat affect and sad mood. Complained of pain to neck and shoulders d/t recent suicide attempt. Skin and contraband search completed and witnessed by Marylu LundJanet, Charity fundraiserN. Admission assessment completed. Patient stated that she ate dinner in the ED prior to coming to unit. Patient remains safe on unit with Q 15 safety checks.

## 2017-06-05 NOTE — ED Notes (Signed)
Patient voices understanding of discharge, [facility], all belongings taken with Patient, she transferred via w/c with nurse and police escort.

## 2017-06-05 NOTE — ED Notes (Signed)
Patient is taking shower. 

## 2017-06-05 NOTE — ED Notes (Signed)
Patient complaining of neck pain and nurse received order for motrin, Patient took without difficulty. Patient is safe, q 15 minute checks and camera surveillance in progress for safety.

## 2017-06-05 NOTE — Tx Team (Signed)
Initial Treatment Plan 06/05/2017 6:32 PM Shannon Galloway [id number]    PATIENT STRESSORS: Financial difficulties Occupational concerns   PATIENT STRENGTHS: Average or above average intelligence Capable of independent living Communication skills   PATIENT IDENTIFIED PROBLEMS: Major Depressive Disorder  Suicidal Ideation                   DISCHARGE CRITERIA:  Improved stabilization in mood, thinking, and/or behavior  PRELIMINARY DISCHARGE PLAN: Outpatient therapy  PATIENT/FAMILY INVOLVEMENT: This treatment plan has been presented to and reviewed with the patient, Shannon Galloway, and/or family member, .  The patient and family have been given the opportunity to ask questions and make suggestions.  [name], [name], RN 06/05/2017, 6:32 PM

## 2017-06-05 NOTE — ED Notes (Signed)

## 2017-06-05 NOTE — Consult Note (Signed)
[facility] Consult   Reason for Consult:  Consult for 21 year old woman brought into the hospital after a suicide attempt Referring Physician:  Darl Householder Patient Identification: Shannon Galloway MRN:  [id number] Principal Diagnosis: Severe recurrent major depression without psychotic features [facility]) Diagnosis:   Patient Active Problem List   Diagnosis Date Noted  . Severe recurrent major depression without psychotic features ([place]) [F33.2] 06/05/2017  . Suicide attempt ([place]) [T14.91XA] 06/05/2017  . Cocaine abuse [F14.10] 06/05/2017  . Amphetamine abuse [F15.10] 06/05/2017  . Cannabis abuse [F12.10] 06/05/2017  . Syncope [R55] 11/22/2016  . Depression [F32.9] 12/22/2015  . Nexplanon insertion [A63.016] 06/10/2013  . General counseling and advice on female contraception [Z30.09] 06/01/2013    Total Time spent with patient: 1 hour  Subjective:   Shannon Galloway is a 21 y.o. female patient admitted with "I tried to kill myself".  HPI:  Patient interviewed chart reviewed. This is a 21 year old woman brought to the emergency room last night after a suicide attempt. Patient says that she tried to hang herself out in the woods using some items of clothing. She says that she woke up on the ground with the noose broken. She apparently had also tried to cut her wrists although she was using a butter knife and so that she didn't have much success. She says she has chronic suicidal ideation but has been more down and negative recently. Major stresses that she says she is completely homeless with no place to stay. No support. She says her mood is sad and depressed most of the time. Sleeps okay. Doesn't meet well. Denies any hallucinations or psychosis. Denies homicidal ideation. Patient says she only drinks "sometimes" and denies that she was intoxicated yesterday. She says she admits that she smokes marijuana from time to time but denies using any other drugs. Her drug screen on  presentation is positive for amphetamine and cocaine and cannabis patient is not currently receiving any outpatient psychiatric treatment  Social history: Patient is not working. Doesn't appear to get disability. She says she's been homeless for a year just sleeping from one place to another. She has some sisters who are her closest relatives but doesn't stay in close contact with him. Her biological parents died when she was an adolescent. Affect  Medical history: Patient's medical chart shows multiple presentations to emergency rooms and hospitals for a variety of complaints many of which have not really resulted in specific diagnosis. There was an episode in the past of presentation for syncope. There was concern that she had some kind of heart problem but after further workup there doesn't seem to be any evidence that she really has a heart problem. Patient tells me today that she has lupus. Looking back over her chart there is no evidence that anyone has ever diagnosed her with that. It looks like there is some evidence that she called a health care provider anxious about it because she found out that one of her relatives have had lupus but there is no evidence that anybody worked it up or gave her that diagnosis. Patient is not currently on any medication  Substance abuse history: Patient denies any drug use except for occasional marijuana. Says she only drinks a little bit. Very vague about it. Presentation drug screen positive for amphetamines and cocaine and cannabis  Past Psychiatric History: Patient reports that she has never seen a psychiatrist or had any psychiatric treatment as an adult. She says she saw a psychiatrist as a child.  She had a past suicide attempt also in the past. She is not however on any medication or getting any psychiatric follow-up. I was unable to find any evidence in the chart of any specific psychiatric evaluation especially in the relevant past.  Risk to Self: Suicidal  Ideation: Yes-Currently Present Suicidal Intent: Yes-Currently Present Is patient at risk for suicide?: No (Currently in the hospital) Suicidal Plan?: Yes-Currently Present Specify Current Suicidal Plan: Hang herself, cut her wrists Access to Means: No What has been your use of drugs/alcohol within the last 12 months?: Patient denied use of drugs or alcohol ((UDS reports positive results for amphetamines, cocaine, mar) How many times?: 2 Other Self Harm Risks: denied Triggers for Past Attempts: None known Intentional Self Injurious Behavior: None Risk to Others: Homicidal Ideation: No Thoughts of Harm to Others: No Current Homicidal Intent: No Current Homicidal Plan: No Access to Homicidal Means: No Identified Victim: None identified History of harm to others?: No Assessment of Violence: None Noted Does patient have access to weapons?: No Criminal Charges Pending?: No Does patient have a court date: No Prior Inpatient Therapy: Prior Inpatient Therapy: No Prior Therapy Dates: n/a Prior Therapy Facilty/Provider(s): n/a Reason for Treatment: n/a Prior Outpatient Therapy: Prior Outpatient Therapy: No Prior Therapy Dates: n/a Prior Therapy Facilty/Provider(s): n/a Reason for Treatment: n/a Does patient have an ACCT team?: No Does patient have Intensive In-House Services?  : No Does patient have [facility] services? : No Does patient have P4CC services?: No  Past Medical History:  Past Medical History:  Diagnosis Date  . Chronic headaches   . Lupus   . Neck pain   . Syncope    a. unclear etilogy. 10/2016: cardiac MRI and echo unremarkable.     Past Surgical History:  Procedure Laterality Date  . KNEE SURGERY Right 2011  . TONSILLECTOMY Bilateral    Family History:  Family History  Problem Relation Age of Onset  . Adopted: Yes  . Heart failure Father        Died at 32  . Bipolar disorder Mother   . Pneumonia Mother        Died at 102   Family Psychiatric  History: She  says that she believes that both of her parents had mental health problems and claims that her mother committed suicide Social History:  History  Alcohol Use  . Yes    Comment: socially     History  Drug Use No    Social History   Social History  . Marital status: Single    Spouse name: N/A  . Number of children: N/A  . Years of education: N/A   Social History Main Topics  . Smoking status: Current Every Day Smoker    Packs/day: 0.01    Years: 0.01    Types: Cigarettes  . Smokeless tobacco: Never Used  . Alcohol use Yes     Comment: socially  . Drug use: No  . Sexual activity: Yes    Birth control/ protection: None   Other Topics Concern  . None   Social History Narrative  . None   Additional Social History:    Allergies:  No Known Allergies  Labs:  Results for orders placed or performed during the hospital encounter of 06/04/17 (from the past 48 hour(s))  Comprehensive metabolic panel     Status: Abnormal   Collection Time: 06/04/17 10:59 PM  Result Value Ref Range   Sodium 141 135 - 145 mmol/L   Potassium 3.9 3.5 -  5.1 mmol/L   Chloride 110 101 - 111 mmol/L   CO2 23 22 - 32 mmol/L   Glucose, Bld 101 (H) 65 - 99 mg/dL   BUN 13 6 - 20 mg/dL   Creatinine, Ser 0.85 0.44 - 1.00 mg/dL   Calcium 9.4 8.9 - 10.3 mg/dL   Total Protein 8.1 6.5 - 8.1 g/dL   Albumin 4.3 3.5 - 5.0 g/dL   AST 20 15 - 41 U/L   ALT 15 14 - 54 U/L   Alkaline Phosphatase 57 38 - 126 U/L   Total Bilirubin 0.4 0.3 - 1.2 mg/dL   GFR calc non Af Amer >60 >60 mL/min   GFR calc Af Amer >60 >60 mL/min    Comment: (NOTE) The eGFR has been calculated using the CKD EPI equation. This calculation has not been validated in all clinical situations. eGFR's persistently <60 mL/min signify possible Chronic Kidney Disease.    Anion gap 8 5 - 15  Ethanol     Status: None   Collection Time: 06/04/17 10:59 PM  Result Value Ref Range   Alcohol, Ethyl (B) <5 <5 mg/dL    Comment:        LOWEST  DETECTABLE LIMIT FOR SERUM ALCOHOL IS 5 mg/dL FOR MEDICAL PURPOSES ONLY   Salicylate level     Status: None   Collection Time: 06/04/17 10:59 PM  Result Value Ref Range   Salicylate Lvl <9.3 2.8 - 30.0 mg/dL  Acetaminophen level     Status: Abnormal   Collection Time: 06/04/17 10:59 PM  Result Value Ref Range   Acetaminophen (Tylenol), Serum <10 (L) 10 - 30 ug/mL    Comment:        THERAPEUTIC CONCENTRATIONS VARY SIGNIFICANTLY. A RANGE OF 10-30 ug/mL MAY BE AN EFFECTIVE CONCENTRATION FOR MANY PATIENTS. HOWEVER, SOME ARE BEST TREATED AT CONCENTRATIONS OUTSIDE THIS RANGE. ACETAMINOPHEN CONCENTRATIONS >150 ug/mL AT 4 HOURS AFTER INGESTION AND >50 ug/mL AT 12 HOURS AFTER INGESTION ARE OFTEN ASSOCIATED WITH TOXIC REACTIONS.   cbc     Status: Abnormal   Collection Time: 06/04/17 10:59 PM  Result Value Ref Range   WBC 12.5 (H) 3.6 - 11.0 K/uL   RBC 4.81 3.80 - 5.20 MIL/uL   Hemoglobin 14.0 12.0 - 16.0 g/dL   HCT 41.3 35.0 - 47.0 %   MCV 85.9 80.0 - 100.0 fL   MCH 29.1 26.0 - 34.0 pg   MCHC 33.8 32.0 - 36.0 g/dL   RDW 13.5 11.5 - 14.5 %   Platelets 278 150 - 440 K/uL  Urine Drug Screen, Qualitative     Status: Abnormal   Collection Time: 06/04/17 10:59 PM  Result Value Ref Range   Tricyclic, Ur Screen NONE DETECTED NONE DETECTED   Amphetamines, Ur Screen POSITIVE (A) NONE DETECTED   MDMA (Ecstasy)Ur Screen NONE DETECTED NONE DETECTED   Cocaine Metabolite,Ur [place] POSITIVE (A) NONE DETECTED   Opiate, Ur Screen NONE DETECTED NONE DETECTED   Phencyclidine (PCP) Ur S NONE DETECTED NONE DETECTED   Cannabinoid 50 Ng, Ur [place] POSITIVE (A) NONE DETECTED   Barbiturates, Ur Screen NONE DETECTED NONE DETECTED   Benzodiazepine, Ur Scrn POSITIVE (A) NONE DETECTED   Methadone Scn, Ur NONE DETECTED NONE DETECTED    Comment: (NOTE) 267  Tricyclics, urine               Cutoff 1000 ng/mL 200  Amphetamines, urine             Cutoff 1000 ng/mL 300  MDMA (Ecstasy), urine           Cutoff 500  ng/mL 400  Cocaine Metabolite, urine       Cutoff 300 ng/mL 500  Opiate, urine                   Cutoff 300 ng/mL 600  Phencyclidine (PCP), urine      Cutoff 25 ng/mL 700  Cannabinoid, urine              Cutoff 50 ng/mL 800  Barbiturates, urine             Cutoff 200 ng/mL 900  Benzodiazepine, urine           Cutoff 200 ng/mL 1000 Methadone, urine                Cutoff 300 ng/mL 1100 1200 The urine drug screen provides only a preliminary, unconfirmed 1300 analytical test result and should not be used for non-medical 1400 purposes. Clinical consideration and professional judgment should 1500 be applied to any positive drug screen result due to possible 1600 interfering substances. A more specific alternate chemical method 1700 must be used in order to obtain a confirmed analytical result.  1800 Gas chromato graphy / mass spectrometry (GC/MS) is the preferred 1900 confirmatory method.   Pregnancy, urine POC     Status: None   Collection Time: 06/04/17 11:48 PM  Result Value Ref Range   Preg Test, Ur NEGATIVE NEGATIVE    Comment:        THE SENSITIVITY OF THIS METHODOLOGY IS >24 mIU/mL     Current Facility-Administered Medications  Medication Dose Route Frequency Provider Last Rate Last Dose  . ibuprofen (ADVIL,MOTRIN) tablet 600 mg  600 mg Oral Q6H PRN Drenda Freeze, MD   600 mg at 06/05/17 1518   No current outpatient prescriptions on file.    Musculoskeletal: Strength & Muscle Tone: within normal limits Gait & Station: normal Patient leans: N/A  Psychiatric Specialty Exam: Physical Exam  Nursing note and vitals reviewed. Constitutional: She appears well-developed and well-nourished.  HENT:  Head: Normocephalic and atraumatic.  Eyes: Conjunctivae are normal. Pupils are equal, round, and reactive to light.  Neck: Normal range of motion.  Cardiovascular: Regular rhythm and normal heart sounds.   Respiratory: Effort normal.  GI: Soft.  Musculoskeletal: Normal  range of motion.  Neurological: She is alert.  Skin: Skin is warm and dry.  Psychiatric: Her speech is normal. She is slowed. She expresses impulsivity. She exhibits a depressed mood. She expresses suicidal ideation. She exhibits abnormal recent memory.    Review of Systems  Constitutional: Negative.   HENT: Negative.   Eyes: Negative.   Respiratory: Negative.   Cardiovascular: Negative.   Gastrointestinal: Negative.   Musculoskeletal: Negative.   Skin: Negative.   Neurological: Negative.   Psychiatric/Behavioral: Positive for depression and suicidal ideas. Negative for hallucinations, memory loss and substance abuse. The patient is nervous/anxious. The patient does not have insomnia.     Blood pressure 128/81, pulse 91, temperature 98 F (36.7 C), temperature source Oral, resp. rate 16, height _0  (1.575 m), weight 79.4 kg (175 lb), last menstrual period 06/02/2017, SpO2 98 %.Body mass index is 32.01 kg/m.  General Appearance: Disheveled  Eye Contact:  Fair  Speech:  Slow  Volume:  Decreased  Mood:  Depressed and Dysphoric  Affect:  Depressed and Tearful  Thought Process:  Goal Directed  Orientation:  Full (Time, Place, and Person)  Thought  Content:  Illogical and Tangential  Suicidal Thoughts:  Yes.  with intent/plan  Homicidal Thoughts:  No  Memory:  Immediate;   Good Recent;   Fair Remote;   Fair  Judgement:  Impaired  Insight:  Shallow  Psychomotor Activity:  Decreased  Concentration:  Concentration: Fair  Recall:  [employer] of Knowledge:  Fair  Language:  Fair  Akathisia:  No  Handed:  Right  AIMS (if indicated):     Assets:  Desire for Improvement Physical Health Social Support  ADL's:  Impaired  Cognition:  WNL  Sleep:        Treatment Plan Summary: Daily contact with patient to assess and evaluate symptoms and progress in treatment, Medication management and Plan 21 year old woman who presents with report that she tried to kill her self by hanging and  cutting. She presents as very depressed and tearful with active suicidal thoughts. Does not appear to be psychotic. Patient is denying substance abuse but has a drug screen that is positive for multiple drugs of abuse. Checking the database there is no evidence that she has been prescribed amphetamines or benzodiazepines or any other controlled substance. Some things about her presentation are a little odd. She is claiming that she's been completely homeless for over a year although it looks like she has had medical contact with various providers during that time. She insists on believing that she wants had an episode of her heart "stopping" and that she has lupus although review of the chart seems to clearly contradict that. It may be that she is misunderstanding or exaggerating her condition. Nevertheless seems to be at high risk of suicide or dangerous behavior. Admit to psychiatric hospital. Continue commitment. Full set of labs will be completed.  Disposition: Patient does not meet criteria for psychiatric inpatient admission. Supportive therapy provided about ongoing stressors.  Alethia Berthold, MD 06/05/2017 3:44 PM

## 2017-06-05 NOTE — ED Notes (Signed)
Pt. To B[facility] from ED ambulatory without difficulty, to room  BHU1. Report from State Street CorporationVanessa RN. Pt. Is alert and oriented, warm and dry in no distress. Pt. Denies SI, HI, and AVH. Pt states she tried to comment suicide today because she was tired of being homeless and living the way she has been living. Pt was tearful during assessment but calm and cooperative. Pt. Made aware of security cameras and Q15 minute rounds. Pt. Encouraged to let Nursing staff know of any concerns or needs.

## 2017-06-05 NOTE — ED Notes (Signed)
Nurse [facility].

## 2017-06-05 NOTE — BH Assessment (Signed)
[facility] Dr. Toni Amendlapacs.  Attending Physician will be Dr. Jennet MaduroPucilowska.   Patient has [facility], [facility] GoodmanPhyllis.   Intake Paper Work has been signed and placed on patient chart.  ER staff is aware of the admission (Dr. Donato HeinzYoa,, ER MD; Bryson HaAmy L., Patient's Nurse & Clydie BraunKaren, Patient Access).

## 2017-06-05 NOTE — ED Notes (Signed)
Patient is talking on the phone, Patient crying on and off. Patient is being monitored, q 15 minute checks.

## 2017-06-05 NOTE — Plan of Care (Signed)
Problem: Role Relationship: Goal: Ability to demonstrate positive changes in social behaviors and relationships will improve Outcome: Progressing Patient verbalized that she will attend group sessions and interact with her peers.
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
## 2017-06-05 NOTE — ED Notes (Signed)
Dr. Toni Amendlapacs talking to Patient. Patient is calm and cooperative.

## 2017-06-05 NOTE — ED Notes (Signed)
Patient ate 100% of supper, with beverage.

## 2017-06-06 DIAGNOSIS — F3181 Bipolar II disorder: Principal | ICD-10-CM

## 2017-06-06 LAB — CHLAMYDIA/NGC RT PCR (ARMC ONLY)
CHLAMYDIA TR: NOT DETECTED
N GONORRHOEAE: NOT DETECTED

## 2017-06-06 LAB — TSH: TSH: 2.603 u[IU]/mL (ref 0.350–4.500)

## 2017-06-06 LAB — LIPID PANEL
CHOLESTEROL: 151 mg/dL (ref 0–200)
HDL: 38 mg/dL — ABNORMAL LOW (ref >40)
LDL Cholesterol: 95 mg/dL (ref 0–99)
TRIGLYCERIDES: 91 mg/dL (ref <150)
Total CHOL/HDL Ratio: 4 RATIO
VLDL: 18 mg/dL (ref 0–40)

## 2017-06-06 MED ORDER — QUETIAPINE FUMARATE 200 MG PO TABS
200.0000 mg | ORAL_TABLET | Freq: Every day | ORAL | Status: DC
  Administered 2017-06-06 – 2017-06-08 (×3): 200 mg via ORAL
  Filled 2017-06-06 (×3): qty 1

## 2017-06-06 MED ORDER — OXCARBAZEPINE 300 MG PO TABS
300.0000 mg | ORAL_TABLET | Freq: Two times a day (BID) | ORAL | Status: DC
  Administered 2017-06-06 – 2017-06-08 (×5): 300 mg via ORAL
  Filled 2017-06-06 (×5): qty 1
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
## 2017-06-06 NOTE — [facility] Group Notes (Signed)
[facility] LCSW Group Therapy  06/06/2017 11:00 AM  Type of Therapy:  Group Therapy  Participation Level:  Active  Participation Quality:  Attentive  Affect:  Appropriate  Cognitive:  Alert  Insight:  Improving  Engagement in Therapy:  Improving  Modes of Intervention:  Activity, Discussion, Education, Problem-solving, Reality Testing, Socialization and Support  Summary of Progress/Problems: Balance in life: Patients will discuss the concept of balance and how it looks and feels to be unbalanced. Pt will identify areas in their life that is unbalanced and ways to become more balanced. They discussed what aspects in their lives has influenced their self care. Patients also discussed self care in the areas of self regulation/control, hygiene/appearance, sleep/relaxation, healthy leisure, healthy eating habits, exercise, inner peace/spirituality, self improvement, sobriety, and health management. They were challenged to identify changes that are needed in order to improve self care.   Kathreen Dileo G. Garnette CzechSampson MSW, LCSWA 06/06/2017, 11:00 AM

## 2017-06-06 NOTE — Progress Notes (Signed)
D: Pt denies SI/HI/AVH, affect is flat and sad, rated depression 6/10 on a scale of 0- 10 . Pt is pleasant and cooperative. Pt  appears less anxious and she  e is interacting with peers and staff appropriately.  A: Pt was offered support and encouragement. Pt was given scheduled medications. Pt was encouraged to attend groups. Q 15 minute checks were done for safety.  R:Pt attends groups and interacts well with peers and staff. Pt is taking medication. Pt has no complaints.Pt receptive to treatment and safety maintained on unit.

## 2017-06-06 NOTE — Progress Notes (Signed)
[facility] MD Progress Note  06/06/2017 3:58 PM Shannon Galloway  MRN:  [id number]  Subjective:   06/07/2017.  Per nursing:  Principal Problem: Bipolar 2 disorder, major depressive episode (HCC) Diagnosis:   Patient Active Problem List   Diagnosis Date Noted  . Bipolar 2 disorder, major depressive episode (HCC) [F31.81] 06/05/2017  . Suicide attempt by hanging (HCC) [T71.162A] 06/05/2017  . Cocaine use disorder, moderate, dependence (HCC) [F14.20] 06/05/2017  . 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 Cannabis use disorder, moderate, dependence (HCC) [F12.20] 06/05/2017  . Sedative, hypnotic or anxiolytic use disorder, severe, dependence (HCC) [F13.20] 06/05/2017  . Tobacco use disorder [F17.200] 06/05/2017  . Syncope [R55] 11/22/2016  . Depression [F32.9] 12/22/2015  . Nexplanon insertion [Z30.017] 06/10/2013  . General counseling and advice on female contraception [Z30.09] 06/01/2013   Total Time spent with patient: 30 minutes  Past Psychiatric History: depression, substance abuse.  Past Medical History:  Past Medical History:  Diagnosis Date  . Chronic headaches   . Lupus   . Neck pain   . Syncope    a. unclear etilogy. 10/2016: cardiac MRI and echo unremarkable.     Past Surgical History:  Procedure Laterality Date  . KNEE SURGERY Right 2011  . TONSILLECTOMY Bilateral    Family History:  Family History  Problem Relation Age of Onset  . Adopted: Yes  . Heart failure Father        Died at 62  . Bipolar disorder Mother   . Pneumonia Mother        Died at 38   Family Psychiatric  History: substance abuse. Social History:  History  Alcohol Use  . Yes    Comment: socially     History  Drug Use No    Social History   Social History  . Marital status: Single    Spouse name: N/A  . Number of children: N/A  . Years of education: N/A   Social History Main Topics  . Smoking status: Current Every Day Smoker    Packs/day: 0.01    Years:  0.01    Types: Cigarettes  . Smokeless tobacco: Never Used  . Alcohol use Yes     Comment: socially  . Drug use: No  . Sexual activity: Yes    Birth control/ protection: None   Other Topics Concern  . None   Social History Narrative  . None   Additional Social History:                         Sleep: Poor  Appetite:  Fair  Current Medications: Current Facility-Administered Medications  Medication Dose Route Frequency Provider Last Rate Last Dose  . acetaminophen (TYLENOL) tablet 650 mg  650 mg Oral Q6H PRN Clapacs, Jackquline Denmark, MD   650 mg at 06/05/17 2226  . alum & mag hydroxide-simeth (MAALOX/MYLANTA) 200-200-20 MG/5ML suspension 30 mL  30 mL Oral Q4H PRN Clapacs, John T, MD      . magnesium hydroxide (MILK OF MAGNESIA) suspension 30 mL  30 mL Oral Daily PRN Clapacs, John T, MD      . nicotine (NICODERM CQ - dosed in mg/24 hours) patch 21 mg  21 mg Transdermal Daily Surafel Hilleary B, MD      . Oxcarbazepine (TRILEPTAL) tablet 300 mg  300 mg Oral BID Abrea Henle B, MD      . QUEtiapine (SEROQUEL) tablet 200 mg  200 mg Oral QHS Fatumata Kashani  B, MD        Lab Results:  Results for orders placed or performed during the hospital encounter of 06/05/17 (from the past 48 hour(s))  Lipid panel     Status: Abnormal   Collection Time: 06/06/17  6:49 AM  Result Value Ref Range   Cholesterol 151 0 - 200 mg/dL   Triglycerides 91 <161 mg/dL   HDL 38 (L) >09 mg/dL   Total CHOL/HDL Ratio 4.0 RATIO   VLDL 18 0 - 40 mg/dL   LDL Cholesterol 95 0 - 99 mg/dL    Comment:        Total Cholesterol/HDL:CHD Risk Coronary Heart Disease Risk Table                     Men   Women  1/2 Average Risk   3.4   3.3  Average Risk       5.0   4.4  2 X Average Risk   9.6   7.1  3 X Average Risk  23.4   11.0        Use the calculated Patient Ratio above and the CHD Risk Table to determine the patient's CHD Risk.        ATP III CLASSIFICATION (LDL):  <100     mg/dL    Optimal  604-540  mg/dL   Near or Above                    Optimal  130-159  mg/dL   Borderline  981-191  mg/dL   High  >478     mg/dL   Very High   TSH     Status: None   Collection Time: 06/06/17  6:49 AM  Result Value Ref Range   TSH 2.603 0.350 - 4.500 uIU/mL    Comment: Performed by a 3rd Generation assay with a functional sensitivity of <=0.01 uIU/mL.    Blood Alcohol level:  Lab Results  Component Value Date   ETH <5 06/04/2017    Metabolic Disorder Labs: Lab Results  Component Value Date   HGBA1C 5.3 11/22/2016   MPG 105 11/22/2016   No results found for: PROLACTIN Lab Results  Component Value Date   CHOL 151 06/06/2017   TRIG 91 06/06/2017   HDL 38 (L) 06/06/2017   CHOLHDL 4.0 06/06/2017   VLDL 18 06/06/2017   LDLCALC 95 06/06/2017   LDLCALC 83 11/23/2016    Physical Findings: AIMS: Facial and Oral Movements Muscles of Facial Expression: None, normal Lips and Perioral Area: None, normal Jaw: None, normal Tongue: None, normal,Extremity Movements Upper (arms, wrists, hands, fingers): None, normal Lower (legs, knees, ankles, toes): None, normal, Trunk Movements Neck, shoulders, hips: None, normal, Overall Severity Severity of abnormal movements (highest score from questions above): None, normal Incapacitation due to abnormal movements: None, normal Patient's awareness of abnormal movements (rate only patient's report): No Awareness, Dental Status Current problems with teeth and/or dentures?: Yes Does patient usually wear dentures?: No  CIWA:    COWS:     Musculoskeletal: Strength & Muscle Tone: within normal limits Gait & Station: normal Patient leans: N/A  Psychiatric Specialty Exam: Physical Exam  Nursing note and vitals reviewed. Psychiatric: Her speech is normal. Her mood appears anxious. She is withdrawn. Cognition and memory are normal. She expresses impulsivity. She exhibits a depressed mood. She expresses suicidal ideation. She expresses  suicidal plans.    Review of Systems  Psychiatric/Behavioral: Positive for depression, substance abuse and  suicidal ideas. The patient is nervous/anxious.   All other systems reviewed and are negative.   Blood pressure 107/68, pulse 92, temperature 98 F (36.7 C), resp. rate 18, height 5\' 2"  (1.575 m), weight 87.3 kg (192 lb 8 oz), last menstrual period 06/02/2017, SpO2 100 %.Body mass index is 35.21 kg/m.  General Appearance: Casual  Eye Contact:  Good  Speech:  Clear and Coherent  Volume:  Normal  Mood:  Anxious  Affect:  Congruent  Thought Process:  Goal Directed and Descriptions of Associations: Intact  Orientation:  Full (Time, Place, and Person)  Thought Content:  WDL  Suicidal Thoughts:  Yes.  with intent/plan  Homicidal Thoughts:  No  Memory:  Immediate;   Fair Recent;   Fair Remote;   Fair  Judgement:  Poor  Insight:  Lacking  Psychomotor Activity:  Decreased  Concentration:  Concentration: Fair and Attention Span: Fair  Recall:  [employer]is[employer]Fair  Fund of Knowledge:  Fair  Language:  Fair  Akathisia:  No  Handed:  Right  AIMS (if indicated):     Assets:  Communication Skills Desire for Improvement Physical Health Resilience  ADL's:  Intact  Cognition:  WNL  Sleep:  Number of Hours: 6     Treatment Plan Summary: Daily contact with patient to assess and evaluate symptoms and progress in treatment and Medication management   Shannon Galloway is a 21 year old female with a history of untreated depression, anxiety, and mood instability admitted after suicide attempt by hanging in the context of substance use and severe social stressors.   1. Suicidal ideation. The patient is able to contract for safety in the hospital.   2. Mood. We started Seroquel and Trileptal for depression and mood stabilization.  3. Substance abuse. She was positive for benzodiazepines, stimulants, and cocaine which she denies taking. She does admit to smoking marijuana regularly. She minimizes her  problems and declines substance abuse treatment.  4. Smoking. Nicotine patch is available.  5. Metabolic syndrome monitoring. Lipid panel, TSH, hemoglobin A1c are pending.  6. EKG. Pending.  7. Lupus. The patient reportedly was diagnosed with lupus during her recent hospitalization in cardiology. She had appointment [facility] which she missed due to lack of transportation.  8. Pregnancy test is negative.  9. STD. We will order testing.  10. Disposition. To be established. The patient is homeless and has no supports.  Kristine LineaJolanta Braxtyn Bojarski, MD 06/06/2017, 3:58 PM

## 2017-06-06 NOTE — Plan of Care (Signed)
Problem: Coping: Goal: Ability to cope will improve Outcome: Progressing No behavior issues observed or reported today. Denies SI/HI/AVH. Support and encouragement provided.

## 2017-06-06 NOTE — Progress Notes (Deleted)
Electrophysiology Office Note   Date:  06/06/2017   ID:  Shannon Leighllie L Clyatt, DOB 04/13/1996, MRN [id number]  PCP:  Patient, No Pcp Per Primary Electrophysiologist:  Giorgio Chabot Jorja LoaMartin Leshon Armistead, MD    No chief complaint on file.    History of Present Illness: Shannon Galloway is a 21 y.o. female who presents today for electrophysiology evaluation.   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 She had a significantly abnormal EKG which was sent over by an outside hospital, but her EKG [facility] as well as her rhythm on telemetry were found to be normal. She underwent cardiac MRI that showed no evidence of infiltrative disease, and had a flecainide challenge that showed no evidence of Brugada syndrome. She wore a 30 day monitor that showed no evidence of any arrhythmia that could cause her episodes of syncope.   Today, denies symptoms of palpitations, chest pain, shortness of breath, orthopnea, PND, lower extremity edema, claudication, dizziness, presyncope, syncope, bleeding, or neurologic sequela. The patient is tolerating medications without difficulties and is otherwise without complaint today.     Past Medical History:  Diagnosis Date  . Chronic headaches   . Lupus   . Neck pain   . Syncope    a. unclear etilogy. 10/2016: cardiac MRI and echo unremarkable.    Past Surgical History:  Procedure Laterality Date  . KNEE SURGERY Right 2011  . TONSILLECTOMY Bilateral      No current facility-administered medications for this visit.    No current outpatient prescriptions on file.   Facility-Administered Medications Ordered in Other Visits  Medication Dose Route Frequency Provider Last Rate Last Dose  . acetaminophen (TYLENOL) tablet 650 mg  650 mg Oral Q6H PRN Clapacs, Jackquline DenmarkJohn T, MD   650 mg at 06/05/17 2226  . alum & mag hydroxide-simeth (MAALOX/MYLANTA) 200-200-20 MG/5ML suspension 30 mL  30 mL Oral Q4H PRN Clapacs, John T, MD      .  magnesium hydroxide (MILK OF MAGNESIA) suspension 30 mL  30 mL Oral Daily PRN Clapacs, John T, MD      . nicotine (NICODERM CQ - dosed in mg/24 hours) patch 21 mg  21 mg Transdermal Daily Pucilowska, Jolanta B, MD      . Oxcarbazepine (TRILEPTAL) tablet 300 mg  300 mg Oral BID Pucilowska, Jolanta B, MD      . QUEtiapine (SEROQUEL) tablet 200 mg  200 mg Oral QHS Pucilowska, Jolanta B, MD        Allergies:   Patient has no known allergies.   Social History:  The patient  reports that she has been smoking Cigarettes.  She has a 0.00 pack-year smoking history. She has never used smokeless tobacco. She reports that she drinks alcohol. She reports that she does not use drugs.   Family History:  The patient's family history includes Bipolar disorder in her mother; Heart failure in her father; Pneumonia in her mother. She was adopted.    ROS:  Please see the history of present illness.   Otherwise, review of systems is positive for ***.   All other systems are reviewed and negative.     PHYSICAL EXAM: VS:  LMP 06/02/2017 Comment: preg test neg. , BMI There is no height or weight on file to calculate BMI. GEN: Well nourished, well developed, in no acute distress  HEENT: normal  Neck: no JVD, carotid bruits, or masses Cardiac: ***RRR; no murmurs, rubs, or gallops,no edema  Respiratory:  clear to auscultation bilaterally, normal work of breathing GI: soft, nontender, nondistended, + BS MS: no deformity or atrophy  Skin: warm and dry, ***device site well healed Neuro:  Strength and sensation are intact Psych: euthymic mood, full affect  EKG:  EKG {ACTION; IS/IS [id number]} ordered today. Personal review of the ekg ordered *** shows ***  Recent Labs: 11/22/2016: Magnesium 2.2 06/04/2017: ALT 15; BUN 13; Creatinine, Ser 0.85; Hemoglobin 14.0; Platelets 278; Potassium 3.9; Sodium 141 06/06/2017: TSH 2.603    Lipid Panel     Component Value Date/Time   CHOL 151 06/06/2017 0649   TRIG 91  06/06/2017 0649   HDL 38 (L) 06/06/2017 0649   CHOLHDL 4.0 06/06/2017 0649   VLDL 18 06/06/2017 0649   LDLCALC 95 06/06/2017 0649     Wt Readings from Last 3 Encounters:  06/04/17 175 lb (79.4 kg)  03/05/17 195 lb 12.8 oz (88.8 kg)  11/26/16 170 lb (77.1 kg)      Other studies Reviewed: Additional studies/ records that were reviewed today include: Telmetry monitor 01/08/17, CMRI 11/23/16 Review of the above records today demonstrates:  The high average heart rate seen for the monitored period was 160 BPM. The low average heart rate seen for the monitored period was 50 BPM. Sinus rhythm with sinus tachycardia seen. No arrhythmia or pauses seen. No obvious cause for syncope.  1. Normal left ventricular size, thickness and systolic function (LVEF = 61%) with no regional wall motion abnormalities.  2. Normal right ventricular size, thickness and systolic function (LVEF = 59%) with no regional wall motion abnormalities.  3.  Normal left and right atrial size.  4. Normal size of the aortic root, thoracic aorta and pulmonary artery.  5. No late gadolinium enhancement in the left or right ventricular myocardium.  6. Normal pericardium. Trivial amount of inferiorly located pericardial effusion.   ASSESSMENT AND PLAN:  1.  Syncope: Had an exhaustive workup for her episode of syncope. Her abnormal EKG could have been on a separate patient which was sent over by an outside hospital. Her EKGs have all been stable since that time. Flecainide challenge was negative for Brugada syndrome, and her cardiac MRI did not show any evidence of infiltrative disease. She wore telemetry that showed no evidence of arrhythmia for 30 days. Time, it is unclear as to the cause of her episode of syncope. It could possibly be noncardiac in nature. I have instructed her to continue to avoid driving for 6 months from her episode of syncope, which was in November. Continued to have episodes where she has  passed out. She says that she has dizziness when she stands up as well as potentially fast heart rates. It is possible that she has pots. I have told her to increase her fluid intake. At this time, without any definitive cardiac diagnosis, we'll not start any medications. We have told her not to drive due to her episodes of syncope for 6 months since her most recent episode. Ryelle Ruvalcaba check a TSH today to rule out thyroid disease.***    Current medicines are reviewed at length with the patient today.   The patient does not have concerns regarding her medicines.  The following changes were made today:  ***  Labs/ tests ordered today include:  No orders of the defined types were placed in this encounter.    Disposition:   FU with Colbey Wirtanen *** months  Signed, Meredyth Hornung Jorja Loa, MD  06/06/2017 2:38 PM     [facility]  [address] [place] [address] [address] [phone] (office) [phone] (fax)

## 2017-06-06 NOTE — Progress Notes (Signed)
Pt slept in bed this morning, refused to get up for breakfast. Came out of room for lunch/dinner. Required redirection related to clothing she was wearing in the milieu, but was redirectable once SW provided a tshirt. Attended group and appropriately interacted with staff/peers. Denies SI/HI/AVH. Complained of some soreness to her neck related to attempt to hang. Pt brightens on approach, appears flat, sad. Medication compliant. Support and encouragement provided. Medications administered as ordered with education. Safety maintained with every 15 minute checks. Will continue to monitor.

## 2017-06-06 NOTE — [facility] Group Notes (Signed)
[facility] LCSW Group Therapy Note  Type of Therapy and Topic:  Group Therapy:  Goals Group: SMART Goals  Participation Level:  Patient attended group on this date and minimally participated in the group discussion.   Description of Group:   The purpose of a daily goals group is to assist and guide patients in setting recovery/wellness-related goals.  The objective is to set goals as they relate to the crisis in which they were admitted. Patients will be using SMART goal modalities to set measurable goals.  Characteristics of realistic goals will be discussed and patients will be assisted in setting and processing how one will reach their goal. Facilitator will also assist patients in applying interventions and coping skills learned in psycho-education groups to the SMART goal and process how one will achieve defined goal.  Therapeutic Goals: -Patients will develop and document one goal related to or their crisis in which brought them into treatment. -Patients will be guided by LCSW using SMART goal setting modality in how to set a measurable, attainable, realistic and time sensitive goal.  -Patients will process barriers in reaching goal. -Patients will process interventions in how to overcome and successful in reaching goal.   Summary of Patient Progress:  Patient Goal: "I want to work on my discharge plan".    Therapeutic Modalities:   Motivational Interviewing Engineer, manufacturing systemsCognitive Behavioral Therapy Crisis Intervention Model SMART goals setting  Raiden Yearwood G. Garnette CzechSampson MSW, LCSWA 06/06/2017 11:00 AM

## 2017-06-06 NOTE — Progress Notes (Signed)
Pt observed out of room in milieu with blanket wrapped around her upper body. Informed pt that blankets are not allowed out of her room per unit policy. Pt returned her blanket to her room and came out of her room with a very revealing tank top with the back out, sports bra able to be seen. Informed patient that the top was not appropriate to wear in milieu, pt immediately got defensive and stated, "the doctor told me I could wear this." Explained further than the top was inappropriate to wear on this unit for the safety of the patient due to the patient population. Offered to get the patient a scrub top to wear over the shirt, but pt is refusing stating, "it itches me." Pt stated, "well, I'll just stay in my room then." SW informed that patient might need a shirt to facilitate her stay. Safety maintained. Will continue to monitor.
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
## 2017-06-06 NOTE — Progress Notes (Signed)
Recreation Therapy Notes  Date: 06.07.18 Time: m Location: Craft Room  Group Topic: Leisure Education  Goal Area(s) Addresses:  Patient will identify activities for each letter of the alphabet. Patient will verbalize ability to integrate positive leisure into life post d/c. Patient will verbalize ability to use leisure as a Associate Professorcoping skill.  Behavioral Response: Attentive, Interactive  Intervention: Leisure Alphabet  Activity: Patients were given a Leisure Information systems managerAlphabet worksheet and were instructed to write healthy leisure activities for each letter of the alphabet.  Education: LRT educated patients on what they need to participate in leisure.  Education Outcome: Acknowledges education/In group clarification offered   Clinical Observations/Feedback: Patient wrote healthy leisure activities. Patient contributed to group discussion by stating healthy leisure activities, what she needs to participate in leisure, and what makes leisure a good coping skill.  Jacquelynn CreeGreene,[place] M, LRT/CTRS 06/06/2017 1:55 PM

## 2017-06-07 LAB — HIV ANTIBODY (ROUTINE TESTING W REFLEX): HIV Screen 4th Generation wRfx: NONREACTIVE

## 2017-06-07 LAB — RPR: RPR: NONREACTIVE

## 2017-06-07 LAB — HEPATITIS C ANTIBODY (REFLEX)

## 2017-06-07 LAB — HEMOGLOBIN A1C
Hgb A1c MFr Bld: 5.1 % (ref 4.8–5.6)
MEAN PLASMA GLUCOSE: 100 mg/dL

## 2017-06-07 LAB — HCV COMMENT:

## 2017-06-07 NOTE — [facility] Group Notes (Signed)
[facility] Group Notes:  (Nursing/MHT/Case Management/Adjunct)  Date:  06/07/2017  Time:  12:41 AM  Type of Therapy:  Group Therapy  Participation Level:  Active  Participation Quality:  Appropriate  Affect:  Appropriate  Cognitive:  Appropriate  Insight:  Good  Engagement in Group:  Supportive  Modes of Intervention:  Support  Summary of Progress/Problems:  Shannon Galloway 06/07/2017, 12:41 AM

## 2017-06-07 NOTE — Tx Team (Signed)
Interdisciplinary Treatment and Diagnostic Plan Update  06/07/2017 Time of Session: 1045 Shannon Galloway MRN: [id number]  Principal Diagnosis: Bipolar 2 disorder, major depressive episode (HCC)  Secondary Diagnoses: Principal Problem:   Bipolar 2 disorder, major depressive episode (HCC) Active Problems:   Suicide attempt by hanging (HCC)   Cocaine use disorder, moderate, dependence (HCC)   Amphetamine use disorder, moderate (HCC)   Cannabis use disorder, moderate, dependence (HCC)   Sedative, hypnotic or anxiolytic use disorder, severe, dependence (HCC)   Tobacco use disorder   Current Medications:  Current Facility-Administered Medications  Medication Dose Route Frequency Provider Last Rate Last Dose  . acetaminophen (TYLENOL) tablet 650 mg  650 mg Oral Q6H PRN Clapacs, Jackquline Denmark, MD   650 mg at 06/05/17 2226  . alum & mag hydroxide-simeth (MAALOX/MYLANTA) 200-200-20 MG/5ML suspension 30 mL  30 mL Oral Q4H PRN Clapacs, John T, MD      . magnesium hydroxide (MILK OF MAGNESIA) suspension 30 mL  30 mL Oral Daily PRN Clapacs, John T, MD      . nicotine (NICODERM CQ - dosed in mg/24 hours) patch 21 mg  21 mg Transdermal Daily Pucilowska, Jolanta B, MD      . Oxcarbazepine (TRILEPTAL) tablet 300 mg  300 mg Oral BID Pucilowska, Jolanta B, MD   300 mg at 06/07/17 0859  . QUEtiapine (SEROQUEL) tablet 200 mg  200 mg Oral QHS Pucilowska, Jolanta B, MD   200 mg at 06/06/17 2234   PTA Medications: No prescriptions prior to admission.    Patient Stressors: Financial difficulties Occupational concerns  Patient Strengths: Average or above average intelligence Capable of independent living Communication skills  Treatment Modalities: Medication Management, Group therapy, Case management,  1 to 1 session with clinician, Psychoeducation, Recreational therapy.   Physician Treatment Plan for Primary Diagnosis: Bipolar 2 disorder, major depressive episode (HCC) Long Term Goal(s): Improvement in  symptoms so as ready for discharge Improvement in symptoms so as ready for discharge   Short Term Goals: Ability to identify changes in lifestyle to reduce recurrence of condition will improve Ability to verbalize feelings will improve Ability to disclose and discuss suicidal ideas Ability to demonstrate self-control will improve Ability to identify and develop effective coping behaviors will improve Ability to identify triggers associated with substance abuse/mental health issues will improve Ability to identify changes in lifestyle to reduce recurrence of condition will improve Ability to demonstrate self-control will improve Ability to identify triggers associated with substance abuse/mental health issues will improve  Medication Management: Evaluate patient's response, side effects, and tolerance of medication regimen.  Therapeutic Interventions: 1 to 1 sessions, Unit Group sessions and Medication administration.  Evaluation of Outcomes: Progressing  Physician Treatment Plan for Secondary Diagnosis: Principal Problem:   Bipolar 2 disorder, major depressive episode (HCC) Active Problems:   Suicide attempt by hanging (HCC)   Cocaine use disorder, moderate, dependence (HCC)   Amphetamine use disorder, moderate (HCC)   Cannabis use disorder, moderate, dependence (HCC)   Sedative, hypnotic or anxiolytic use disorder, severe, dependence (HCC)   Tobacco use disorder  Long Term Goal(s): Improvement in symptoms so as ready for discharge Improvement in symptoms so as ready for discharge   Short Term Goals: Ability to identify changes in lifestyle to reduce recurrence of condition will improve Ability to verbalize feelings will improve Ability to disclose and discuss suicidal ideas Ability to demonstrate self-control will improve Ability to identify and develop effective coping behaviors will improve Ability to identify triggers associated with substance  abuse/mental health issues will  improve Ability to identify changes in lifestyle to reduce recurrence of condition will improve Ability to demonstrate self-control will improve Ability to identify triggers associated with substance abuse/mental health issues will improve     Medication Management: Evaluate patient's response, side effects, and tolerance of medication regimen.  Therapeutic Interventions: 1 to 1 sessions, Unit Group sessions and Medication administration.  Evaluation of Outcomes: Progressing   RN Treatment Plan for Primary Diagnosis: Bipolar 2 disorder, major depressive episode (HCC) Long Term Goal(s): Knowledge of disease and therapeutic regimen to maintain health will improve  Short Term Goals: Ability to remain free from injury will improve, Ability to identify and develop effective coping behaviors will improve and Compliance with prescribed medications will improve  Medication Management: RN will administer medications as ordered by provider, will assess and evaluate patient's response and provide education to patient for prescribed medication. RN will report any adverse and/or side effects to prescribing provider.  Therapeutic Interventions: 1 on 1 counseling sessions, Psychoeducation, Medication administration, Evaluate responses to treatment, Monitor vital signs and CBGs as ordered, Perform/monitor CIWA, COWS, AIMS and Fall Risk screenings as ordered, Perform wound care treatments as ordered.  Evaluation of Outcomes: Progressing   LCSW Treatment Plan for Primary Diagnosis: Bipolar 2 disorder, major depressive episode (HCC) Long Term Goal(s): Safe transition to appropriate next level of care at discharge, Engage patient in therapeutic group addressing interpersonal concerns.  Short Term Goals: Engage patient in aftercare planning with referrals and resources, Increase social support and Increase skills for wellness and recovery  Therapeutic Interventions: Assess for all discharge needs, 1 to 1  time with Social worker, Explore available resources and support systems, Assess for adequacy in community support network, Educate family and significant other(s) on suicide prevention, Complete Psychosocial Assessment, Interpersonal group therapy.  Evaluation of Outcomes: Progressing   Progress in Treatment: Attending groups: Yes. Participating in groups: Yes. Taking medication as prescribed: Yes. Toleration medication: Yes. Family/Significant other contact made: No, will contact:  when given permission Patient understands diagnosis: Yes. Discussing patient identified problems/goals with staff: Yes. Medical problems stabilized or resolved: Yes. Denies suicidal/homicidal ideation: Yes. Issues/concerns per patient self-inventory: No. Other: none  New problem(s) identified: No, Describe:  none  New Short Term/Long Term Goal(s):Pt goal is to "gain perspective on my problems."  Discharge Plan or Barriers: CSW assessing for appropriate plan.  Reason for Continuation of Hospitalization: Depression Medication stabilization  Estimated Length of Stay: 3-5 days.  Attendees: Patient: Shannon Galloway 06/07/2017  Physician: Dr. Jennet MaduroPucilowska, MD 06/07/2017   Nursing: Hulan AmatoGwen Farrish, RN 06/07/2017   RN Care Manager: 06/07/2017   Social Worker: Daleen SquibbGreg Velvie Thomaston, LCSW 06/07/2017   Recreational Therapist:  06/07/2017   Other:  06/07/2017   Other:  06/07/2017   Other: 06/07/2017        Scribe for Treatment Team: Lorri FrederickWierda, Vaishnavi Dalby Jon, LCSW 06/07/2017 1:59 PM

## 2017-06-07 NOTE — Progress Notes (Signed)
D: Pt denies SI/HI/AVH. Pt is pleasant and cooperative, affect is sad but brightens upon approach. Pt appears less anxious and she is interacting with peers and staff appropriately.Paatient's thoughts are organized, no bizarre behavior noted.  A: Pt was offered support and encouragement. Pt was given scheduled medications. Pt was encouraged to attend groups. Q 15 minute checks were done for safety.  R:Pt attends groups and interacts well with peers and staff. Pt is complaint with medication. Pt has no complaints.Pt receptive to treatment and safety maintained on unit.

## 2017-06-07 NOTE — Plan of Care (Signed)
Problem: Health Behavior/Discharge Planning: Goal: Identification of resources available to assist in meeting health care needs will improve Outcome: Progressing Identifies support system for after discharge

## 2017-06-07 NOTE — Progress Notes (Addendum)
[facility] MD Progress Note  06/07/2017 11:45 AM Shannon Galloway  MRN:  [id number]  Subjective:   06/07/2017. Shannon Galloway feels much better today as she was able to talk to a friend and seems to have a place to go to following discharge. She is much more upbeat. She met with treatment team. She is optimistic about the future and hopes that she will be able to attend college in the fall for which she has already secured funds for. She tolerates medictions well. Good program participation. No somatic complaints.  Per nursing: D: Pt denies SI/HI/AVH. Pt is pleasant and cooperative, affect is sad but brightens upon approach. Pt appears less anxious and she is interacting with peers and staff appropriately.Paatient's thoughts are organized, no bizarre behavior noted.  A: Pt was offered support and encouragement. Pt was given scheduled medications. Pt was encouraged to attend groups. Q 15 minute checks were done for safety.  R:Pt attends groups and interacts well with peers and staff. Pt is complaint with medication. Pt has no complaints.Pt receptive to treatment and safety maintained on unit.  Principal Problem: Bipolar 2 disorder, major depressive episode ([place]) Diagnosis:   Patient Active Problem List   Diagnosis Date Noted  . Bipolar 2 disorder, major depressive episode ([place]) [F31.81] 06/05/2017  . Suicide attempt by hanging ([place]) [T71.162A] 06/05/2017  . Cocaine use disorder, moderate, dependence ([place]) [F14.20] 06/05/2017  . Amphetamine use disorder, moderate ([place]) [F15.20] 06/05/2017  . Cannabis use disorder, moderate, dependence ([place]) [F12.20] 06/05/2017  . Sedative, hypnotic or anxiolytic use disorder, severe, dependence ([place]) [F13.20] 06/05/2017  . Tobacco use disorder [F17.200] 06/05/2017  . Syncope [R55] 11/22/2016  . Depression [F32.9] 12/22/2015  . Nexplanon insertion [C48.889] 06/10/2013  . General counseling and advice on female contraception [Z30.09] 06/01/2013   Total Time spent  with patient: 30 minutes  Past Psychiatric History: depression, substance abuse.  Past Medical History:  Past Medical History:  Diagnosis Date  . Chronic headaches   . Lupus   . Neck pain   . Syncope    a. unclear etilogy. 10/2016: cardiac MRI and echo unremarkable.     Past Surgical History:  Procedure Laterality Date  . KNEE SURGERY Right 2011  . TONSILLECTOMY Bilateral    Family History:  Family History  Problem Relation Age of Onset  . Adopted: Yes  . Heart failure Father        Died at 50  . Bipolar disorder Mother   . Pneumonia Mother        Died at 31   Family Psychiatric  History: substance abuse. Social History:  History  Alcohol Use  . Yes    Comment: socially     History  Drug Use No    Social History   Social History  . Marital status: Single    Spouse name: N/A  . Number of children: N/A  . Years of education: N/A   Social History Main Topics  . Smoking status: Current Every Day Smoker    Packs/day: 0.01    Years: 0.01    Types: Cigarettes  . Smokeless tobacco: Never Used  . Alcohol use Yes     Comment: socially  . Drug use: No  . Sexual activity: Yes    Birth control/ protection: None   Other Topics Concern  . None   Social History Narrative  . None   Additional Social History:  Sleep: Poor  Appetite:  Fair  Current Medications: Current Facility-Administered Medications  Medication Dose Route Frequency Provider Last Rate Last Dose  . acetaminophen (TYLENOL) tablet 650 mg  650 mg Oral Q6H PRN Clapacs, John T, MD   650 mg at 06/05/17 2226  . alum & mag hydroxide-simeth (MAALOX/MYLANTA) 200-200-20 MG/5ML suspension 30 mL  30 mL Oral Q4H PRN Clapacs, John T, MD      . magnesium hydroxide (MILK OF MAGNESIA) suspension 30 mL  30 mL Oral Daily PRN Clapacs, John T, MD      . nicotine (NICODERM CQ - dosed in mg/24 hours) patch 21 mg  21 mg Transdermal Daily ,  B, MD      .  Oxcarbazepine (TRILEPTAL) tablet 300 mg  300 mg Oral BID ,  B, MD   300 mg at 06/07/17 0859  . QUEtiapine (SEROQUEL) tablet 200 mg  200 mg Oral QHS ,  B, MD   200 mg at 06/06/17 2234    Lab Results:  Results for orders placed or performed during the hospital encounter of 06/05/17 (from the past 48 hour(s))  Hemoglobin A1c     Status: None   Collection Time: 06/06/17  6:49 AM  Result Value Ref Range   Hgb A1c MFr Bld 5.1 4.8 - 5.6 %    Comment: (NOTE)         Pre-diabetes: 5.7 - 6.4         Diabetes: >6.4         Glycemic control for adults with diabetes: <7.0    Mean Plasma Glucose 100 mg/dL    Comment: (NOTE) Performed At: [facility] [address] [place], [place] [id number] H[place] William F MD Ph:[phone]   Lipid panel     Status: Abnormal   Collection Time: 06/06/17  6:49 AM  Result Value Ref Range   Cholesterol 151 0 - 200 mg/dL   Triglycerides 91 <150 mg/dL   HDL 38 (L) >40 mg/dL   Total CHOL/HDL Ratio 4.0 RATIO   VLDL 18 0 - 40 mg/dL   LDL Cholesterol 95 0 - 99 mg/dL    Comment:        Total Cholesterol/HDL:CHD Risk Coronary Heart Disease Risk Table                     Men   Women  1/2 Average Risk   3.4   3.3  Average Risk       5.0   4.4  2 X Average Risk   9.6   7.1  3 X Average Risk  23.4   11.0        Use the calculated Patient Ratio above and the CHD Risk Table to determine the patient's CHD Risk.        ATP III CLASSIFICATION (LDL):  <100     mg/dL   Optimal  100-129  mg/dL   Near or Above                    Optimal  130-159  mg/dL   Borderline  160-189  mg/dL   High  >190     mg/dL   Very High   TSH     Status: None   Collection Time: 06/06/17  6:49 AM  Result Value Ref Range   TSH 2.603 0.350 - 4.500 uIU/mL    Comment: Performed by a 3rd Generation assay with a functional sensitivity of <=0.01 uIU/mL.  RPR       Status: None   Collection Time: 06/06/17  4:40 PM  Result Value Ref Range   RPR Ser Ql  Non Reactive Non Reactive    Comment: (NOTE) Performed At: [facility] [address] [place], [place] [id number] H[place] William F MD Ph:[phone]   HIV antibody     Status: None   Collection Time: 06/06/17  4:40 PM  Result Value Ref Range   HIV Screen 4th Generation wRfx Non Reactive Non Reactive    Comment: (NOTE) Performed At: [facility] [address] [place], [place] [id number] H[place] William F MD Ph:[phone]   Hepatitis c antibody (reflex)     Status: None   Collection Time: 06/06/17  4:40 PM  Result Value Ref Range   HCV Ab <0.1 0.0 - 0.9 s/co ratio    Comment: (NOTE) Performed At: [facility] [address] [place], [place] [id number] H[place] William F MD Ph:[phone]   HCV Comment:     Status: None   Collection Time: 06/06/17  4:40 PM  Result Value Ref Range   Comment: Comment     Comment: (NOTE) Non reactive HCV antibody screen is consistent with no HCV infection, unless recent infection is suspected or other evidence exists to indicate HCV infection. Performed At: [facility] [address] [place], [place] [id number] H[place] William F MD Ph:[phone]   Chlamydia/NGC rt PCR ([facility] only)     Status: None   Collection Time: 06/06/17  6:20 PM  Result Value Ref Range   Specimen source GC/Chlam URINE, RANDOM    Chlamydia Tr NOT DETECTED NOT DETECTED   N gonorrhoeae NOT DETECTED NOT DETECTED    Comment: (NOTE) 100  This methodology has not been evaluated in pregnant women or in 200  patients with a history of hysterectomy. 300 400  This methodology will not be performed on patients less than 14  years of age.     Blood Alcohol level:  Lab Results  Component Value Date   ETH <5 06/04/2017    Metabolic Disorder Labs: Lab Results  Component Value Date   HGBA1C 5.1 06/06/2017   MPG 100 06/06/2017   MPG 105 11/22/2016   No results found for: PROLACTIN Lab Results  Component Value Date   CHOL 151 06/06/2017    TRIG 91 06/06/2017   HDL 38 (L) 06/06/2017   CHOLHDL 4.0 06/06/2017   VLDL 18 06/06/2017   LDLCALC 95 06/06/2017   LDLCALC 83 11/23/2016    Physical Findings: AIMS: Facial and Oral Movements Muscles of Facial Expression: None, normal Lips and Perioral Area: None, normal Jaw: None, normal Tongue: None, normal,Extremity Movements Upper (arms, wrists, hands, fingers): None, normal Lower (legs, knees, ankles, toes): None, normal, Trunk Movements Neck, shoulders, hips: None, normal, Overall Severity Severity of abnormal movements (highest score from questions above): None, normal Incapacitation due to abnormal movements: None, normal Patient's awareness of abnormal movements (rate only patient's report): No Awareness, Dental Status Current problems with teeth and/or dentures?: Yes Does patient usually wear dentures?: No  CIWA:    COWS:     Musculoskeletal: Strength & Muscle Tone: within normal limits Gait & Station: normal Patient leans: N/A  Psychiatric Specialty Exam: Physical Exam  Nursing note and vitals reviewed. Psychiatric: Her speech is normal. Her mood appears anxious. She is withdrawn. Cognition and memory are normal. She expresses impulsivity. She exhibits a depressed mood. She expresses suicidal ideation. She expresses suicidal plans.    Review of Systems  Psychiatric/Behavioral: Positive for depression, substance abuse and   suicidal ideas. The patient is nervous/anxious.   All other systems reviewed and are negative.   Blood pressure 101/74, pulse 83, temperature 98.5 F (36.9 C), temperature source Oral, resp. rate 18, height 5' 2" (1.575 m), weight 87.3 kg (192 lb 8 oz), last menstrual period 06/02/2017, SpO2 100 %.Body mass index is 35.21 kg/m.  General Appearance: Casual  Eye Contact:  Good  Speech:  Clear and Coherent  Volume:  Normal  Mood:  Anxious  Affect:  Congruent  Thought Process:  Goal Directed and Descriptions of Associations: Intact  Orientation:   Full (Time, Place, and Person)  Thought Content:  WDL  Suicidal Thoughts:  Yes.  with intent/plan  Homicidal Thoughts:  No  Memory:  Immediate;   Fair Recent;   Fair Remote;   Fair  Judgement:  Poor  Insight:  Lacking  Psychomotor Activity:  Decreased  Concentration:  Concentration: Fair and Attention Span: Fair  Recall:  Fair  Fund of Knowledge:  Fair  Language:  Fair  Akathisia:  No  Handed:  Right  AIMS (if indicated):     Assets:  Communication Skills Desire for Improvement Physical Health Resilience  ADL's:  Intact  Cognition:  WNL  Sleep:  Number of Hours: 6     Treatment Plan Summary: Daily contact with patient to assess and evaluate symptoms and progress in treatment and Medication management   Shannon Galloway is a 20-year-old female with a history of untreated depression, anxiety, and mood instability admitted after suicide attempt by hanging in the context of substance use and severe social stressors.   1. Suicidal ideation. The patient is able to contract for safety in the hospital.   2. Mood. We started Seroquel and Trileptal for depression and mood stabilization.  3. Substance abuse. She was positive for benzodiazepines, stimulants, and cocaine which she denies taking. She does admit to smoking marijuana regularly. She minimizes her problems and declines substance abuse treatment.  4. Smoking. Nicotine patch is available.  5. Metabolic syndrome monitoring. Lipid panel, TSH, hemoglobin A1c are norml.  6. EKG. Normal sinus rhythm, QTc 403.  7. Lupus. The patient reportedly was diagnosed with lupus during her recent hospitalization in cardiology. She had appointment with Dr. Doss at [facility] in May which she missed due to lack of transportation.  8. Pregnancy test is negative.  9. STD. RPR, HIV, Hep C, GNC all negative.   10. Disposition. To be established. The patient is homeless and has no supports.   , MD 06/07/2017,  11:45 AM 

## 2017-06-07 NOTE — Progress Notes (Signed)
Pt. Calm, cooperative and pleasant.  Guarded, minimal/vague responses to assessment questions, although does have pleasant affect.  Denies SI/HI/AVH.  Frequently isolative to room.  Med-compliant.  Refused nicotine patch.  No behavioral issues.
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
## 2017-06-07 NOTE — Plan of Care (Signed)
Problem: Medication: Goal: Compliance with prescribed medication regimen will improve Outcome: Progressing Patient is compliant with medication  Regimen.

## 2017-06-07 NOTE — B[facility] Suicide Risk Assessment (Signed)
B[facility] INPATIENT:  Family/Significant Other Suicide Prevention Education  Suicide Prevention Education:  Contact Attempts: Jennette Dubinllie Scott, friend, [phone], has been identified by the patient as the family member/significant other with whom the patient will be residing, and identified as the person(s) who will aid the patient in the event of a mental health crisis.  With written consent from the patient, two attempts were made to provide suicide prevention education, prior to and/or following the patient's discharge.  We were unsuccessful in providing suicide prevention education.  A suicide education pamphlet was given to the patient to share with family/significant other.  Date and time of first attempt:06/07/17, 1455 Date and time of second attempt:  Lorri FrederickWierda, Promyse Ardito Jon, LCSW 06/07/2017, 2:55 PM

## 2017-06-08 MED ORDER — QUETIAPINE FUMARATE 200 MG PO TABS: 200.0000 mg | ORAL_TABLET | Freq: Every day | ORAL | 1 refills | Status: DC

## 2017-06-08 MED ORDER — OXCARBAZEPINE 300 MG PO TABS: 300.0000 mg | ORAL_TABLET | Freq: Two times a day (BID) | ORAL | 1 refills | Status: DC

## 2017-06-08 NOTE — B[facility] Group Notes (Signed)
[facility] Group Notes:  (Nursing/MHT/Case Management/Adjunct)  Date:  06/08/2017  Time:  2:32 AM  Type of Therapy:  Evening Wrap-up Group  Participation Level:  Active  Participation Quality:  Appropriate and Attentive  Affect:  Appropriate  Cognitive:  Alert and Appropriate  Insight:  Appropriate  Engagement in Group:  Developing/Improving and Engaged  Modes of Intervention:  Activity and Discussion  Summary of Progress/Problems:  Tomasita MorrowChelsea Nanta Belinda Schlichting 06/08/2017, 2:32 AM

## 2017-06-08 NOTE — [facility] Suicide Risk Assessment (Signed)
[facility]   Principal Problem: Bipolar 2 disorder, major depressive episode [facility](HCC) Discharge Diagnoses:  Patient Active Problem List   Diagnosis Date Noted  . Bipolar 2 disorder, major depressive episode (HCC) [F31.81] 06/05/2017  . Suicide attempt by hanging (HCC) [T71.162A] 06/05/2017  . Cocaine use disorder, moderate, dependence (HCC) [F14.20] 06/05/2017  . 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 Cannabis use disorder, moderate, dependence (HCC) [F12.20] 06/05/2017  . Sedative, hypnotic or anxiolytic use disorder, severe, dependence (HCC) [F13.20] 06/05/2017  . Tobacco use disorder [F17.200] 06/05/2017  . Syncope [R55] 11/22/2016  . Depression [F32.9] 12/22/2015  . Nexplanon insertion [Z30.017] 06/10/2013  . General counseling and advice on female contraception [Z30.09] 06/01/2013    Total Time spent with patient: 30 minutes  Musculoskeletal: Strength & Muscle Tone: within normal limits Gait & Station: normal Patient leans: N/A  Psychiatric Specialty Exam: Review of Systems  Psychiatric/Behavioral: Positive for substance abuse.  All other systems reviewed and are negative.   Blood pressure 112/68, pulse 82, temperature 98.2 F (36.8 C), temperature source Oral, resp. rate 18, height 5\' 2"  (1.575 m), weight 87.3 kg (192 lb 8 oz), last menstrual period 06/02/2017, SpO2 100 %.Body mass index is 35.21 kg/m.  General Appearance: Casual  Eye Contact::  Good  Speech:  Clear and Coherent409  Volume:  Normal  Mood:  Euthymic  Affect:  Appropriate  Thought Process:  Goal Directed and Descriptions of Associations: Intact  Orientation:  Full (Time, Place, and Person)  Thought Content:  WDL  Suicidal Thoughts:  No  Homicidal Thoughts:  No  Memory:  Immediate;   Fair Recent;   Fair Remote;   Fair  Judgement:  Impaired  Insight:  Fair  Psychomotor Activity:  Normal  Concentration:  Fair  Recall:  [employer]Fair  Fund of Knowledge:Fair   Language: Fair  Akathisia:  No  Handed:  Right  AIMS (if indicated):     Assets:  Communication Skills Desire for Improvement Housing Physical Health Resilience Social Support Talents/Skills  Sleep:  Number of Hours: 5.3  Cognition: WNL  ADL's:  Intact   Mental Status Per Nursing Assessment::   On Admission:  NA  Demographic Factors:  Adolescent or young adult, Caucasian and Low socioeconomic status  Loss Factors: Decrease in vocational status, Loss of significant relationship and Financial problems/change in socioeconomic status  Historical Factors: Prior suicide attempts, Family history of mental illness or substance abuse and Impulsivity  Risk Reduction Factors:   Sense of responsibility to family, Living with another person, especially a relative and Positive social support  Continued Clinical Symptoms:  Bipolar Disorder:   Bipolar II Depressive phase Alcohol/Substance Abuse/Dependencies  Cognitive Features That Contribute To Risk:  None    Suicide Risk:  Minimal: No identifiable suicidal ideation.  Patients presenting with no risk factors but with morbid ruminations; may be classified as minimal risk based on the severity of the depressive symptoms  Follow-up Information    [facility]. Go on 06/10/2017.   Why:  Please attend your hospital discharge appointment on Monday, 06/10/17, at 11:30am.  Please bring a copy of your hospital discharge paperwork. Contact information: [address] [place], [place] [address] [phone] F: [phone]          Plan Of Care/Follow-up recommendations:  Activity:  As tolerated. Diet:  regular Other:  keep follow up appointments.  Kristine LineaJolanta Analysse Quinonez, MD 06/08/2017, 9:29 AM

## 2017-06-08 NOTE — Progress Notes (Signed)
  [facility] :  Will you be returning to the same living situation after discharge:  No. plans to live w friends At discharge, do you have transportation home?: Yes,  friends to transport Do you have the ability to pay for your medications: No.uninsured, referred to provider who can assist  Release of information consent forms completed and in the chart;  Patient's signature needed at discharge.  Patient to Follow up at: Follow-up Information    [facility]. Go on 06/10/2017.   Why:  Please attend your hospital discharge appointment on Monday, 06/10/17, at 11:30am.  Please bring a copy of your hospital discharge paperwork. Contact information: [address] [place], [place] [address] [phone] F: [phone]          Next level of care provider [facility] Link:no  Safety Planning and Suicide Prevention discussed: Yes,  friend Gerarda Guntherllie contacted (see SPE note) and patient education brochure provided   Have you used any form of tobacco in the last 30 days? (Cigarettes, Smokeless Tobacco, Cigars, and/or Pipes): Yes  Has patient been referred to the Quitline?: Patient refused referral  Patient has been referred for addiction treatment: Yes  Sallee Langenne C Kenli Waldo 06/08/2017, 1:20 PM

## 2017-06-08 NOTE — Plan of Care (Signed)
Problem: Coping: Goal: Ability to cope will improve Outcome: Progressing Patient was pleasant when approached and displayed good eye contact.  She spent time talking on the phone and later stated, "everyone wanted to talk to me at the same time!" (regarding family and friends calling).  She spent time with peers watching television and playing cards. She denies having thoughts of self harm and contracts for safety.

## 2017-06-08 NOTE — Progress Notes (Signed)
[facility] LCSW Group Therapy   06/08/2017 1pm Type of Therapy: Group Therapy   Participation Level: Active   Participation Quality: Attentive, Sharing and Supportive   Affect: Depressed and Flat   Cognitive: Alert and Oriented   Insight: Developing/Improving and Engaged   Engagement in Therapy: Developing/Improving and Engaged   Modes of Intervention: Clarification, Confrontation, Discussion, Education, Exploration,  Limit-setting, Orientation, Problem-solving, Rapport Building, Dance movement psychotherapisteality Testing, Socialization and Support   Summary of Progress/Problems: Pt identified obstacles faced currently and processed barriers involved in overcoming these obstacles. Pt identified steps necessary for overcoming these obstacles and explored motivation (internal and external) for facing these difficulties head on. Pt further identified one area of concern in their lives and chose a goal to focus on for today.  Pt came to group in the last five minutes of the session and then left abruptly at its closing.  Dorothe PeaJonathan F. Koltin Wehmeyer, LCSWA, LCAS   '

## 2017-06-08 NOTE — Progress Notes (Signed)
Pt calm and cooperative.  Remains frequently isolative, responds to assessment questions with vague answers.  Observed interacting with select peers.  Med compliant.  No behavioral issues.  Discharge orders placed.  Patient initially planned for friend to pick her up, but then decided her sister would.  At this time, plan is uncertain - pt is currently on phone making arrangements.  Discharge information reviewed, including medications, follow up appointments and help line.  Pt verbalized understanding.
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
## 2017-06-08 NOTE — Progress Notes (Signed)
Patient spent most of this evening on the phone calling her relatives and friends, trying to arrange transportation for discharge.  She had a review of discharge instructions with Clinical research associatewriter.  She was given one week supply of medications and prescriptions.  She was escorted to her friends car at 2150.

## 2017-06-10 ENCOUNTER — Ambulatory Visit: Payer: Self-pay | Admitting: Cardiology

## 2017-06-14 ENCOUNTER — Encounter: Payer: Self-pay | Admitting: Cardiology

## 2017-07-15 ENCOUNTER — Ambulatory Visit: Payer: Self-pay | Admitting: Cardiology

## 2017-07-16 ENCOUNTER — Encounter: Payer: Self-pay | Admitting: Cardiology

## 2017-11-11 IMAGING — CT CT HEAD W/O CM
3 of 4 series · 18 of 47 positions shown, 21 images · non-contrast
Comparison: 11/22/2016

CLINICAL DATA: 19-year-old female with acute altered mental status
dizziness and headache today.

EXAM:
CT HEAD WITHOUT CONTRAST
TECHNIQUE: Contiguous axial images were obtained from the base of the skull
through the vertex without intravenous contrast.

[Series 201: head w/o, idose (1) · axial · non-contrast · 0.42mm/px · z∈[+78,+198]mm · 12 of 29 slices shown, 15 images]
[im 3/29  brain]
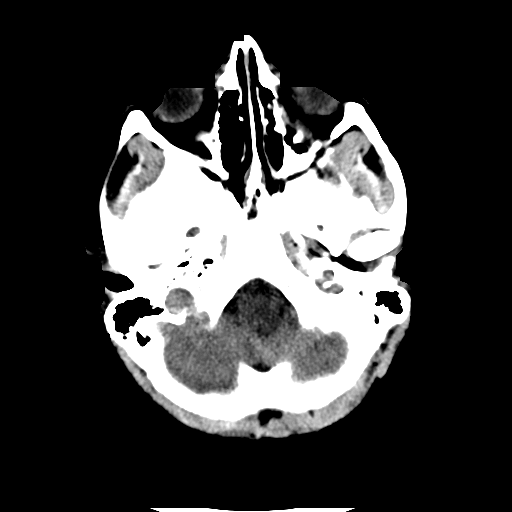
[im 3/29  bone]
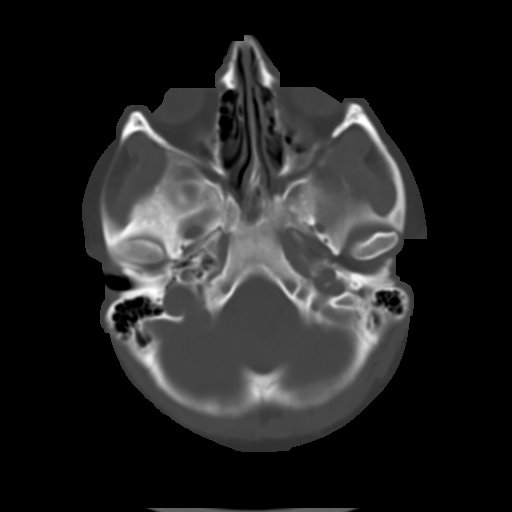
[im 5/29  brain]
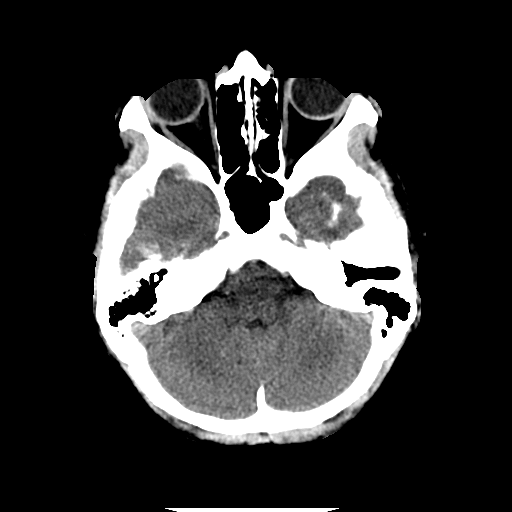
[im 7/29  brain]
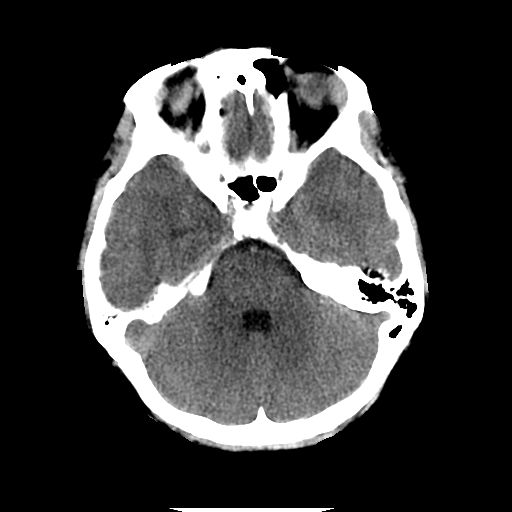
[im 9/29  brain]
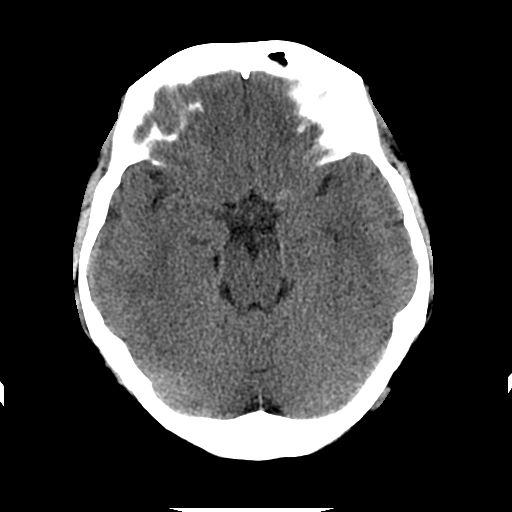
[im 11/29  brain]
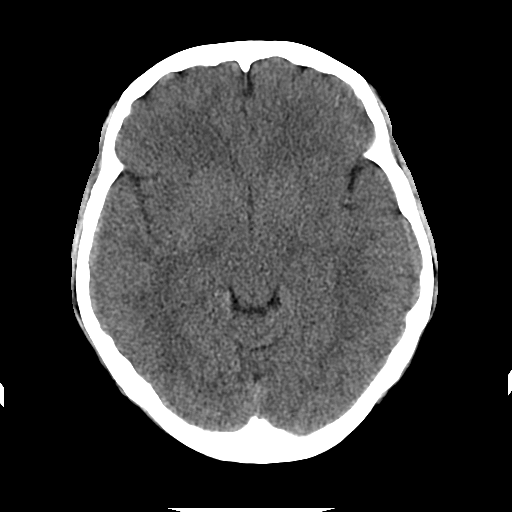
[im 11/29  bone]
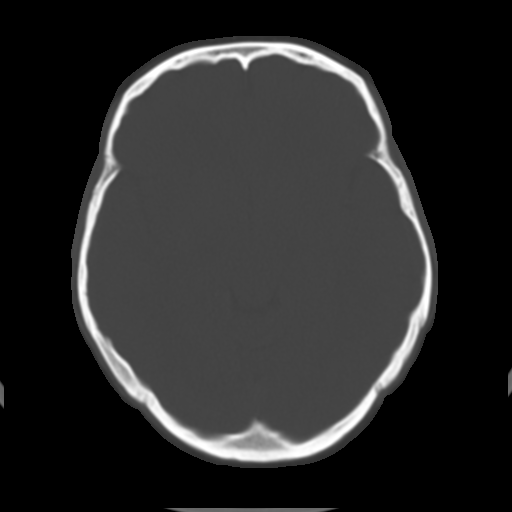
[im 13/29  brain]
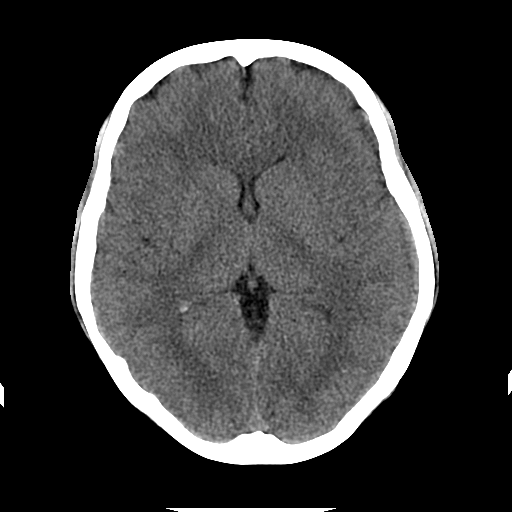
[im 17/29  brain]
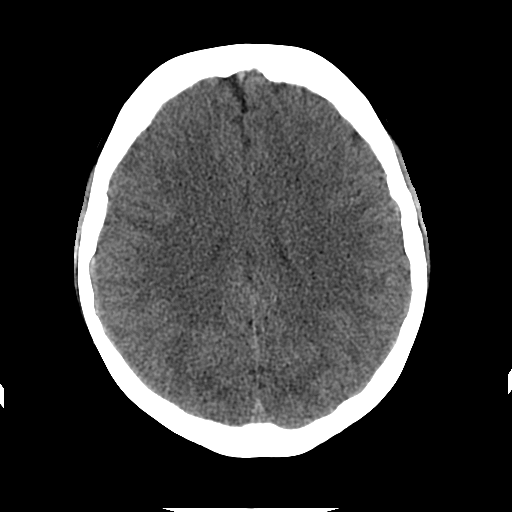
[im 19/29  brain]
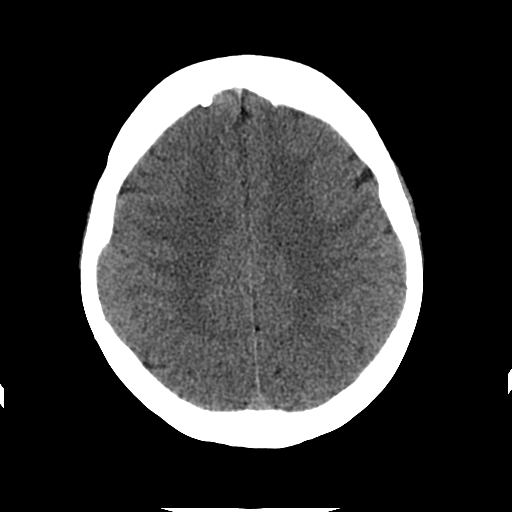
[im 21/29  brain]
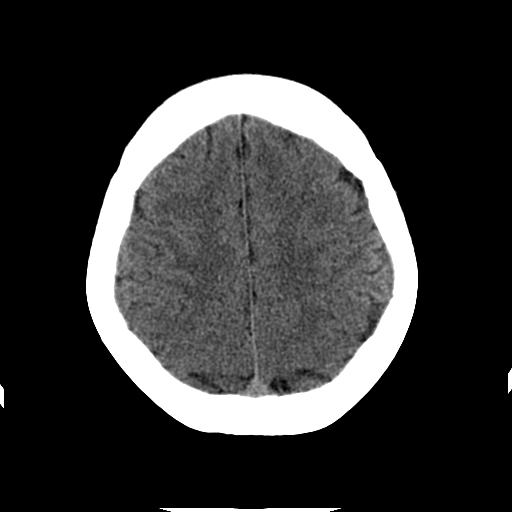
[im 21/29  bone]
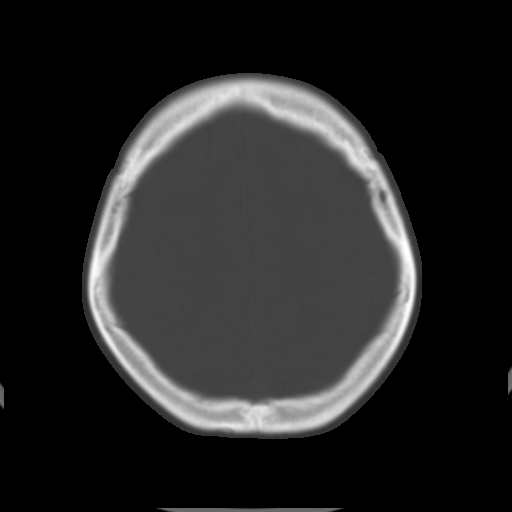
[im 23/29  brain]
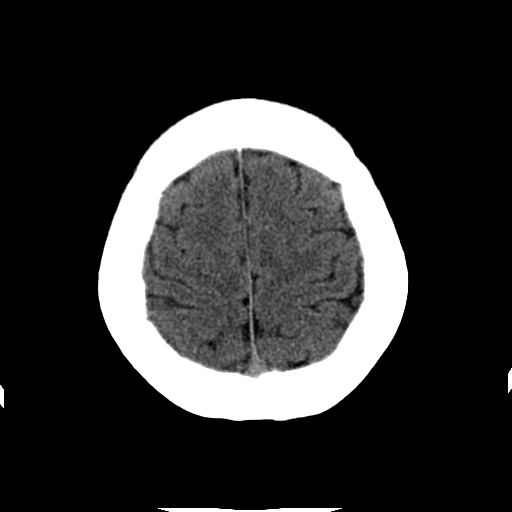
[im 25/29  brain]
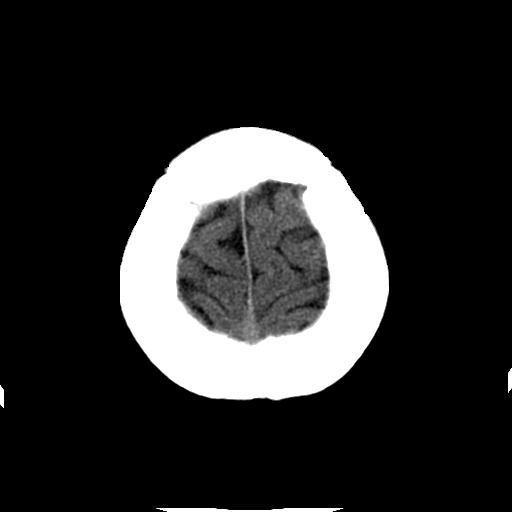
[im 27/29  brain]
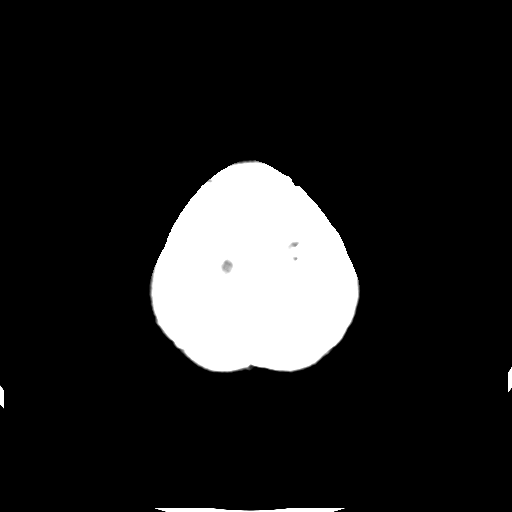

[Series 203: coronal st, idose (1) · coronal · 0.40mm/px · 3 of 69 slices shown]
[im 23/69  brain]
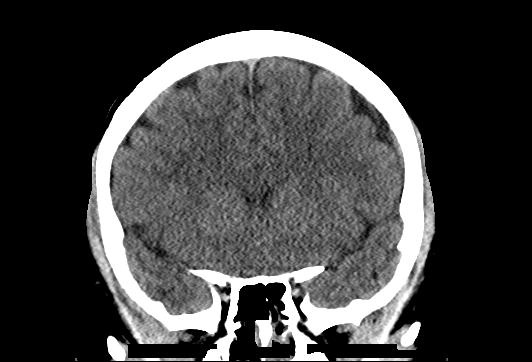
[im 31/69  brain]
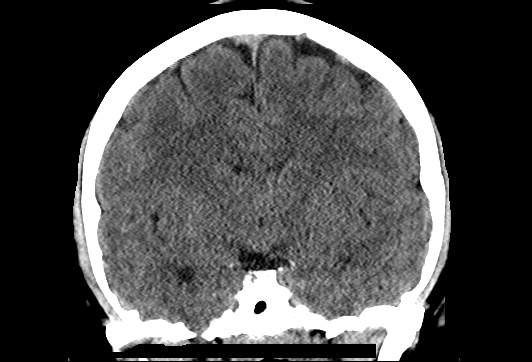
[im 38/69  brain]
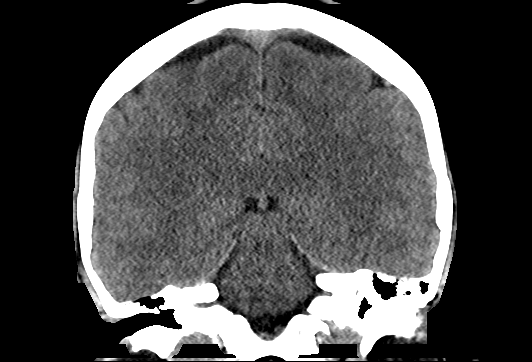

[Series 204: sagittal st, idose (1) · sagittal · 0.40mm/px · 3 of 71 slices shown]
[im 24/71  brain]
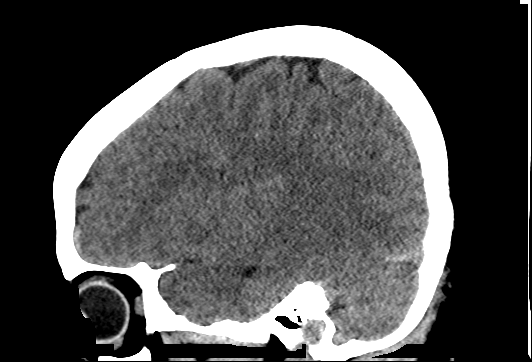
[im 36/71  brain]
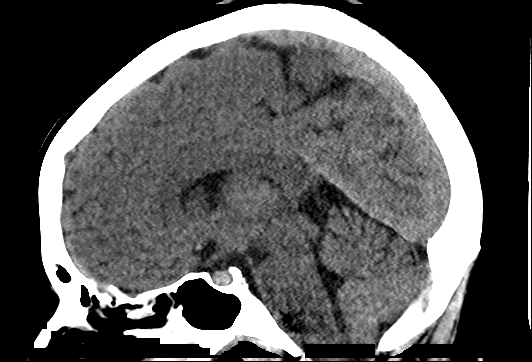
[im 47/71  brain]
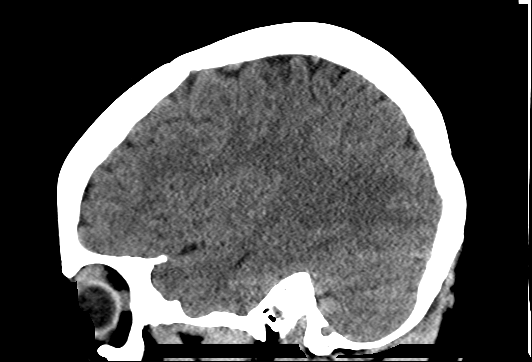

[18 of 47 positions shown; findings below may reference images not displayed]

FINDINGS: Brain: No intracranial abnormalities are identified, including mass
lesion or mass effect, hydrocephalus, extra-axial fluid collection,
midline shift, hemorrhage, or acute infarction.

Vascular: No hyperdense vessel or unexpected calcification.

Skull: Normal. Negative for fracture or focal lesion.

Sinuses/Orbits: Small amount of fluid within the maxillary sinuses
again noted. No other abnormalities identified.

Other: None.
IMPRESSION: No evidence of intracranial abnormality.

Air-fluid levels within both maxillary sinuses which may represent
acute sinusitis.

## 2017-12-16 ENCOUNTER — Emergency Department (HOSPITAL_COMMUNITY)
Admission: EM | Admit: 2017-12-16 | Discharge: 2017-12-16 | Disposition: A | Discharge status: Home / Self Care | Payer: BC Managed Care – PPO | Attending: Emergency Medicine | Admitting: Emergency Medicine

## 2017-12-16 DIAGNOSIS — Z5321 Procedure and treatment not carried out due to patient leaving prior to being seen by health care provider: Secondary | ICD-10-CM | POA: Insufficient documentation

## 2017-12-16 DIAGNOSIS — R0602 Shortness of breath: Secondary | ICD-10-CM | POA: Insufficient documentation

## 2017-12-16 NOTE — ED Notes (Signed)
Called patient for triage with no answer.  

## 2017-12-17 ENCOUNTER — Emergency Department (HOSPITAL_COMMUNITY): Payer: BC Managed Care – PPO

## 2017-12-17 ENCOUNTER — Emergency Department (HOSPITAL_COMMUNITY)
Admission: EM | Admit: 2017-12-17 | Discharge: 2017-12-17 | Disposition: A | Discharge status: Home / Self Care | Payer: BC Managed Care – PPO | Attending: Emergency Medicine | Admitting: Emergency Medicine

## 2017-12-17 ENCOUNTER — Encounter (HOSPITAL_COMMUNITY): Payer: Self-pay | Admitting: Family Medicine

## 2017-12-17 ENCOUNTER — Other Ambulatory Visit: Payer: Self-pay

## 2017-12-17 DIAGNOSIS — IMO0002 Reserved for concepts with insufficient information to code with codable children: Secondary | ICD-10-CM

## 2017-12-17 DIAGNOSIS — F17210 Nicotine dependence, cigarettes, uncomplicated: Secondary | ICD-10-CM | POA: Insufficient documentation

## 2017-12-17 DIAGNOSIS — M3210 Systemic lupus erythematosus, organ or system involvement unspecified: Secondary | ICD-10-CM | POA: Insufficient documentation

## 2017-12-17 DIAGNOSIS — M329 Systemic lupus erythematosus, unspecified: Secondary | ICD-10-CM

## 2017-12-17 DIAGNOSIS — R21 Rash and other nonspecific skin eruption: Secondary | ICD-10-CM | POA: Insufficient documentation

## 2017-12-17 DIAGNOSIS — F319 Bipolar disorder, unspecified: Secondary | ICD-10-CM | POA: Insufficient documentation

## 2017-12-17 DIAGNOSIS — F419 Anxiety disorder, unspecified: Secondary | ICD-10-CM | POA: Insufficient documentation

## 2017-12-17 LAB — COMPREHENSIVE METABOLIC PANEL
ALBUMIN: 4.3 g/dL (ref 3.5–5.0)
ALT: 18 U/L (ref 14–54)
ANION GAP: 7 (ref 5–15)
AST: 20 U/L (ref 15–41)
Alkaline Phosphatase: 48 U/L (ref 38–126)
BUN: 12 mg/dL (ref 6–20)
CHLORIDE: 108 mmol/L (ref 101–111)
CO2: 22 mmol/L (ref 22–32)
Calcium: 9 mg/dL (ref 8.9–10.3)
Creatinine, Ser: 0.74 mg/dL (ref 0.44–1.00)
GFR calc Af Amer: >60 mL/min (ref >60)
GLUCOSE: 90 mg/dL (ref 65–99)
POTASSIUM: 3.8 mmol/L (ref 3.5–5.1)
Sodium: 137 mmol/L (ref 135–145)
Total Bilirubin: 0.7 mg/dL (ref 0.3–1.2)
Total Protein: 7.6 g/dL (ref 6.5–8.1)

## 2017-12-17 LAB — CBC
HEMATOCRIT: 39.7 % (ref 36.0–46.0)
HEMOGLOBIN: 13.8 g/dL (ref 12.0–15.0)
MCH: 30.5 pg (ref 26.0–34.0)
MCHC: 34.8 g/dL (ref 30.0–36.0)
MCV: 87.8 fL (ref 78.0–100.0)
Platelets: 263 10*3/uL (ref 150–400)
RBC: 4.52 MIL/uL (ref 3.87–5.11)
RDW: 13 % (ref 11.5–15.5)
WBC: 9.7 10*3/uL (ref 4.0–10.5)

## 2017-12-17 LAB — I-STAT BETA HCG BLOOD, ED (MC, WL, AP ONLY): I-stat hCG, quantitative: <5 m[IU]/mL (ref <5)

## 2017-12-17 LAB — I-STAT TROPONIN, ED: Troponin i, poc: 0 ng/mL (ref 0.00–0.08)

## 2017-12-17 MED ORDER — LORAZEPAM 1 MG PO TABS: 1.0000 mg | ORAL_TABLET | Freq: Two times a day (BID) | ORAL | 0 refills | Status: DC | PRN

## 2017-12-17 MED ORDER — LORAZEPAM 1 MG PO TABS
1.0000 mg | ORAL_TABLET | Freq: Once | ORAL | Status: AC
  Administered 2017-12-17: 1 mg via ORAL
  Filled 2017-12-17: qty 1

## 2017-12-17 NOTE — ED Provider Notes (Deleted)
MSE was initiated and I personally evaluated the patient and placed orders (if any) at  6:18 PM on December 17, 2017.  The patient appears stable so that the remainder of the MSE may be completed by another provider.  Patient was screened in triage for lupus flare with symptoms including fever at home, mild URI symptoms, white intraoral patches characteristic of lupus flare, and red, non-raised generalized rash. She reports palpitations and agitation like her anxiety and panic, which she states is also characteristic of lupus flare. 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 [facility] and was told to come in for evaluation.   Of note, the patient reports ongoing baseline depression without change. She adamantly denies SI or HI. She reports a miscommunication with the triage nurse. She has had a previous admission for SI when her depression was uncontrolled but reiterates no suicidal thoughts, actions or ideas tonight.   She will need further evaluation in acute care for current symptoms.    Elpidio AnisUpstill, Ksean Vale, PA-C 12/17/17 [phone]

## 2017-12-17 NOTE — ED Notes (Signed)
Bed: [facility] Expected date:  Expected time:  Means of arrival:  Comments: 

## 2017-12-17 NOTE — Discharge Instructions (Signed)
Follow up with your doctor with any recurrent symptoms.

## 2017-12-17 NOTE — ED Triage Notes (Signed)
Pt is alert and oriented x 4 and is verbally responsive. Pt is escorted with Sig other. Pt denies pain at this time pt does have a rash to her Rt arm. Pt reports that it is from a rash rt forearm .

## 2017-12-17 NOTE — ED Notes (Signed)
Patient was informed of plan of care of blood work, EKG, to undress, and chest x-ray. After blood work was performed, patient attempted to leave reporting she needed to smoke a cigarette. Called for security and she escorted back to triage 4. Melvenia BeamShari, PA is assessing patient for evaluation of suicidal thoughts.

## 2017-12-17 NOTE — ED Notes (Signed)
Melvenia BeamShari, PA has evaluated patient. She been cleared to be placed outside in the lobby and wait after initiating orders are started.

## 2017-12-17 NOTE — ED Triage Notes (Signed)
Patient is complaining of a lupus flare up with rash and mouth sores. Also, during triage she reports she is suicidal and attempted to change her mind about not being suicidal. Patient reports she has been anxious with heart palpitations and having increased stress with family concerns. [facility] and left due to the wait time but was recommended to return by her physician with [facility].

## 2017-12-17 NOTE — ED Provider Notes (Signed)
[facility]-EMERGENCY DEPT Provider Note   CSN: [id number] Arrival date & time: 12/17/17  1713     History   Chief Complaint Chief Complaint  Patient presents with  . Rash  . Anxiety  . Psychiatric Evaluation    HPI Shannon Galloway is a 21 y.o. female.  Patient with a history of lupus, here for lupus flare with symptoms including fever at home, mild URI symptoms, white intraoral patches characteristic of lupus flare, and red, non-raised generalized rash. She reports palpitations and agitation like her anxiety and panic, which she states is also characteristic of lupus flare. 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 She is followed at [facility] for her lupus and was told to come in for evaluation.    The history is provided by the patient. No language interpreter was used.  Rash    Anxiety  Associated symptoms include shortness of breath.    Past Medical History:  Diagnosis Date  . Chronic headaches   . Lupus   . Neck pain   . Syncope    a. unclear etilogy. 10/2016: cardiac MRI and echo unremarkable.     Patient Active Problem List   Diagnosis Date Noted  . Bipolar 2 disorder, major depressive episode (HCC) 06/05/2017  . Suicide attempt by hanging (HCC) 06/05/2017  . Cocaine use disorder, moderate, dependence (HCC) 06/05/2017  . Amphetamine use disorder, moderate (HCC) 06/05/2017  . Cannabis use disorder, moderate, dependence (HCC) 06/05/2017  . Sedative, hypnotic or anxiolytic use disorder, severe, dependence (HCC) 06/05/2017  . Tobacco use disorder 06/05/2017  . Syncope 11/22/2016  . Depression 12/22/2015  . Nexplanon insertion 06/10/2013  . General counseling and advice on female contraception 06/01/2013    Past Surgical History:  Procedure Laterality Date  . KNEE SURGERY Right 2011  . TONSILLECTOMY Bilateral     OB History    Gravida Para Term Preterm AB Living   0 0 0 0 0 0   SAB TAB  Ectopic Multiple Live Births   0 0 0 0         Home Medications    Prior to Admission medications   Medication Sig Start Date End Date Taking? Authorizing Provider  acetaminophen (TYLENOL) 500 MG tablet Take 1,000 mg by mouth every 6 (six) hours as needed for mild pain or headache.   Yes [provider]  hydroxychloroquine (PLAQUENIL) 200 MG tablet Take 400 mg by mouth daily. 11/08/17  Yes [provider]  Oxcarbazepine (TRILEPTAL) 300 MG tablet Take 1 tablet (300 mg total) by mouth 2 (two) times daily. Patient not taking: Reported on 12/17/2017 06/08/17   Pucilowska, Braulio ConteJolanta B, MD  QUEtiapine (SEROQUEL) 200 MG tablet Take 1 tablet (200 mg total) by mouth at bedtime. Patient not taking: Reported on 12/17/2017 06/08/17    ProwsPucilowska, Jolanta B, MD    Family History Family History  Adopted: Yes  Problem Relation Age of Onset  . Heart failure Father        Died at 1441  . Bipolar disorder Mother   . Pneumonia Mother        Died at 6139    Social History Social History   Tobacco Use  . Smoking status: Current Every Day Smoker    Packs/day: 0.01    Years: 0.01    Pack years: 0.00    Types: Cigarettes  . Smokeless tobacco: Never Used  Substance Use Topics  . Alcohol use: Yes  Comment: Once a week.   . Drug use: Yes    Types: Marijuana    Comment: Last used: 2 weeks ago     Allergies   Patient has no known allergies.   Review of Systems Review of Systems  Constitutional: Positive for chills and fever.  HENT: Positive for mouth sores.   Respiratory: Positive for chest tightness and shortness of breath.   Cardiovascular: Positive for palpitations.  Gastrointestinal: Positive for nausea. Negative for vomiting.  Musculoskeletal: Negative.   Skin: Positive for rash.  Neurological: Negative.   Psychiatric/Behavioral: Positive for agitation. The patient is nervous/anxious.      Physical Exam Updated Vital Signs BP 126/76 (BP Location: Left Arm)   Pulse  82   Temp 98.4 F (36.9 C) (Oral)   Resp 18   Ht 5\' 2"  (1.575 m)   Wt 83.9 kg (185 lb)   LMP 12/13/2017   SpO2 100%   BMI 33.84 kg/m   Physical Exam  Constitutional: She is oriented to person, place, and time. She appears well-developed and well-nourished.  HENT:  Head: Normocephalic.  Neck: Normal range of motion. Neck supple.  Cardiovascular: Normal rate and regular rhythm.  No murmur heard. Pulmonary/Chest: Effort normal and breath sounds normal. She has no wheezes. She has no rales. She exhibits no tenderness.  Abdominal: Soft. Bowel sounds are normal. There is no tenderness. There is no rebound and no guarding.  Musculoskeletal: Normal range of motion.  Neurological: She is alert and oriented to person, place, and time.  Skin: Skin is warm and dry. No rash noted.  Psychiatric: Her mood appears anxious. Her speech is rapid and/or pressured. She is agitated. She is not actively hallucinating. Thought content is not paranoid and not delusional. She expresses no homicidal and no suicidal ideation.     ED Treatments / Results  Labs (all labs ordered are listed, but only abnormal results are displayed) Labs Reviewed  COMPREHENSIVE METABOLIC PANEL  CBC  I-STAT BETA HCG BLOOD, ED ([facility], [facility], [facility] ONLY)  I-STAT TROPONIN, ED    EKG  EKG Interpretation  Date/Time:  Tuesday December 17 2017 18:36:03 EST Ventricular Rate:  83 PR Interval:    QRS Duration: 101 QT Interval:  358 QTC Calculation: 421 R Axis:   63 Text Interpretation:  Sinus rhythm RSR' in V1 or V2, right VCD or RVH No significant change since last tracing Confirmed by Richardean CanalYao, David H [id number]) on 12/17/2017 8:21:46 PM       Radiology Dg Chest 2 View  Result Date: 12/17/2017 CLINICAL DATA:  Chest pain EXAM: CHEST  2 VIEW COMPARISON:  11/22/2016 FINDINGS: Heart and mediastinal contours are within normal limits. No focal opacities or effusions. No acute bony abnormality. IMPRESSION: No active cardiopulmonary disease.  Electronically Signed   By: Charlett NoseKevin  Dover M.D.   On: 12/17/2017 18:50    Procedures Procedures (including critical care time)  Medications Ordered in ED Medications  LORazepam (ATIVAN) tablet 1 mg (1 mg Oral Given 12/17/17 1835)     Initial Impression / Assessment and Plan / ED Course  I have reviewed the triage vital signs and the nursing notes.  Pertinent labs & imaging results that were available during my care of the patient were reviewed by me and considered in my medical decision making (see chart for details).     Patient with a history of lupus presents for evaluation of possible flare. She reports fever at home but no fever in ED. EKG shows NSR, normal rate.  Labs are completely unremarkable. She feels better after an Ativan provided in ED.   Rash is going but less red. She feels much more at ease. She is felt appropriate for discharge home to follow up with her doctor at [facility].   Of note, the patient reports ongoing baseline depression without change. She adamantly denies SI or HI. She reports a miscommunication with the triage nurse. She has had a previous admission for SI when her depression was uncontrolled but reiterates no suicidal thoughts, actions or ideas tonight.     Final Clinical Impressions(s) / ED Diagnoses   Final diagnoses:  None   1. Anxiety 2. History of lupus   ED Discharge Orders    None       Danne Harbor 12/17/17 2105    Charlynne Pander, MD 12/17/17 [phone]

## 2018-06-04 ENCOUNTER — Ambulatory Visit: Payer: Self-pay | Admitting: Cardiology

## 2018-06-04 DIAGNOSIS — R0989 Other specified symptoms and signs involving the circulatory and respiratory systems: Secondary | ICD-10-CM

## 2018-06-04 NOTE — Progress Notes (Deleted)
Electrophysiology Office Note   Date:  06/04/2018   ID:  Shannon Galloway, DOB 10/01/1996, MRN [id number]  PCP:  Patient, No Pcp Per Primary Electrophysiologist:  [place] Mckeough Jorja Loa, MD    No chief complaint on file.    History of Present Illness: Shannon Galloway is a 22 y.o. female who presents today for electrophysiology evaluation.   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 She had a significantly abnormal EKG which was sent over by an outside hospital, but her EKG [facility] as well as her rhythm on telemetry were found to be normal. She underwent cardiac MRI that showed no evidence of infiltrative disease, and had a flecainide challenge that showed no evidence of Brugada syndrome. She wore a 30 day monitor that showed no evidence of any arrhythmia that could cause her episodes of syncope.  Today, denies symptoms of palpitations, chest pain, shortness of breath, orthopnea, PND, lower extremity edema, claudication, dizziness, presyncope, syncope, bleeding, or neurologic sequela. The patient is tolerating medications without difficulties. ***    Past Medical History:  Diagnosis Date  . Chronic headaches   . Lupus   . Neck pain   . Syncope    a. unclear etilogy. 10/2016: cardiac MRI and echo unremarkable.    Past Surgical History:  Procedure Laterality Date  . KNEE SURGERY Right 2011  . TONSILLECTOMY Bilateral      Current Outpatient Medications  Medication Sig Dispense Refill  . acetaminophen (TYLENOL) 500 MG tablet Take 1,000 mg by mouth every 6 (six) hours as needed for mild pain or headache.    . hydroxychloroquine (PLAQUENIL) 200 MG tablet Take 400 mg by mouth daily.  5  . LORazepam (ATIVAN) 1 MG tablet Take 1 tablet (1 mg total) by mouth 2 (two) times daily as needed for anxiety. 4 tablet 0  . Oxcarbazepine (TRILEPTAL) 300 MG tablet Take 1 tablet (300 mg total) by mouth 2 (two) times daily. (Patient not taking:  Reported on 12/17/2017) 180 tablet 1  . QUEtiapine (SEROQUEL) 200 MG tablet Take 1 tablet (200 mg total) by mouth at bedtime. (Patient not taking: Reported on 12/17/2017) 90 tablet 1   No current facility-administered medications for this visit.     Allergies:   Patient has no known allergies.   Social History:  The patient  reports that she has been smoking cigarettes.  She has a 0.00 pack-year smoking history. She has never used smokeless tobacco. She reports that she drinks alcohol. She reports that she has current or past drug history. Drug: Marijuana.   Family History:  The patient's family history includes Bipolar disorder in her mother; Heart failure in her father; Pneumonia in her mother. She was adopted.    ROS:  Please see the history of present illness.   Otherwise, review of systems is positive for ***.   All other systems are reviewed and negative.   PHYSICAL EXAM: VS:  There were no vitals taken for this visit. , BMI There is no height or weight on file to calculate BMI. GEN: Well nourished, well developed, in no acute distress  HEENT: normal  Neck: no JVD, carotid bruits, or masses Cardiac: ***RRR; no murmurs, rubs, or gallops,no edema  Respiratory:  clear to auscultation bilaterally, normal work of breathing GI: soft, nontender, nondistended, + BS MS: no deformity or atrophy  Skin: warm and dry Neuro:  Strength and sensation are intact Psych: euthymic mood, full affect  EKG:  EKG {ACTION; IS/IS [id number]} ordered today. Personal review of the ekg ordered *** shows ***   Recent Labs: 06/06/2017: TSH 2.603 12/17/2017: ALT 18; BUN 12; Creatinine, Ser 0.74; Hemoglobin 13.8; Platelets 263; Potassium 3.8; Sodium 137    Lipid Panel     Component Value Date/Time   CHOL 151 06/06/2017 0649   TRIG 91 06/06/2017 0649   HDL 38 (L) 06/06/2017 0649   CHOLHDL 4.0 06/06/2017 0649   VLDL 18 06/06/2017 0649   LDLCALC 95 06/06/2017 0649     Wt Readings from Last 3  Encounters:  12/17/17 185 lb (83.9 kg)  06/04/17 175 lb (79.4 kg)  03/05/17 195 lb 12.8 oz (88.8 kg)      Other studies Reviewed: Additional studies/ records that were reviewed today include: Telmetry monitor 01/08/17, CMRI 11/23/16 Review of the above records today demonstrates:  The high average heart rate seen for the monitored period was 160 BPM. The low average heart rate seen for the monitored period was 50 BPM. Sinus rhythm with sinus tachycardia seen. No arrhythmia or pauses seen. No obvious cause for syncope.  1. Normal left ventricular size, thickness and systolic function (LVEF = 61%) with no regional wall motion abnormalities.  2. Normal right ventricular size, thickness and systolic function (LVEF = 59%) with no regional wall motion abnormalities.  3.  Normal left and right atrial size.  4. Normal size of the aortic root, thoracic aorta and pulmonary artery.  5. No late gadolinium enhancement in the left or right ventricular myocardium.  6. Normal pericardium. Trivial amount of inferiorly located pericardial effusion.   ASSESSMENT AND PLAN:  1.  Syncope: Had an exhaustive workup for her episode of syncope. Her abnormal EKG could have been on a separate patient which was sent over by an outside hospital. Her EKGs have all been stable since that time. Flecainide challenge was negative for Brugada syndrome, and her cardiac MRI did not show any evidence of infiltrative disease. She wore telemetry that showed no evidence of arrhythmia for 30 days. Time, it is unclear as to the cause of her episode of syncope. It could possibly be noncardiac in nature. I have instructed her to continue to avoid driving for 6 months from her episode of syncope, which was in November. Continued to have episodes where she has passed out. She says that she has dizziness when she stands up as well as potentially fast heart rates. It is possible that she has pots. I have told her to increase  her fluid intake. At this time, without any definitive cardiac diagnosis, we'll not start any medications. We have told her not to drive due to her episodes of syncope for 6 months since her most recent episode. Aneudy Champlain check a TSH today to rule out thyroid disease.***    Current medicines are reviewed at length with the patient today.   The patient does not have concerns regarding her medicines.  The following changes were made today:  ***  Labs/ tests ordered today include:  No orders of the defined types were placed in this encounter.    Disposition:   FU with Chondra Boyde *** months  Signed, Camie Hauss Jorja LoaMartin Eilleen Davoli, MD  06/04/2018 7:27 AM     [facility] [address] [place] [address] [phone] (office) [phone] (fax)

## 2018-06-05 ENCOUNTER — Encounter: Payer: Self-pay | Admitting: Cardiology

## 2021-09-14 ENCOUNTER — Encounter (HOSPITAL_COMMUNITY): Payer: Self-pay

## 2021-09-14 ENCOUNTER — Emergency Department (HOSPITAL_COMMUNITY)
Admission: EM | Admit: 2021-09-14 | Discharge: 2021-09-15 | Disposition: A | Discharge status: Home / Self Care | Payer: Medicaid Other | Attending: Emergency Medicine | Admitting: Emergency Medicine

## 2021-09-14 ENCOUNTER — Emergency Department (HOSPITAL_COMMUNITY): Payer: Medicaid Other

## 2021-09-14 DIAGNOSIS — Z5321 Procedure and treatment not carried out due to patient leaving prior to being seen by health care provider: Secondary | ICD-10-CM | POA: Diagnosis not present

## 2021-09-14 DIAGNOSIS — N9489 Other specified conditions associated with female genital organs and menstrual cycle: Secondary | ICD-10-CM | POA: Diagnosis not present

## 2021-09-14 DIAGNOSIS — R109 Unspecified abdominal pain: Secondary | ICD-10-CM | POA: Insufficient documentation

## 2021-09-14 LAB — I-STAT BETA HCG BLOOD, ED (MC, WL, AP ONLY): I-stat hCG, quantitative: <5 m[IU]/mL (ref <5)

## 2021-09-14 NOTE — ED Triage Notes (Signed)
Pt from home d/t having really sharp pain in lower abdomen that radiates from left to right. Patient stating to have discharge and bleeding that's been black, brown and red. Patient states she has a hx of miscarriages as she also endorses a hx of lupus. Patient states the pain is like a menstrual cramp, but much worse in feeling.

## 2021-09-14 NOTE — ED Provider Notes (Signed)
Emergency Medicine Provider Triage Evaluation Note  Shannon Galloway , a 25 y.o. female  was evaluated in triage.  Pt complains of vaginal bleeding.  States that she is approximately [redacted] weeks pregnant.  Interestingly, pregnancy test from yesterday was negative and chart review.  Patient does report associated lower abdominal pelvic cramping.  She reports history of lupus.  States that this pain is worse than she has experienced before..  Review of Systems  Positive: Pelvic pain, vaginal bleeding Negative: Fever, chills  Physical Exam  BP (!) 131/100 (BP Location: Left Arm)   Pulse 80   Temp 99.1 F (37.3 C) (Oral)   Resp 18   Ht 5\' 2"  (1.575 m)   Wt 90.7 kg   SpO2 100%   BMI 36.58 kg/m  Gen:   Awake, no distress   Resp:  Normal effort  MSK:   Moves extremities without difficulty  Other:    Medical Decision Making  Medically screening exam initiated at 10:28 PM.  Appropriate orders placed.  Shannon Galloway was informed that the remainder of the evaluation will be completed by another provider, this initial triage assessment does not replace that evaluation, and the importance of remaining in the ED until their evaluation is complete.  Vaginal bleeding   Shannon Galloway 09/14/21 2229    2230, MD 09/14/21 2328

## 2021-09-15 LAB — CBC WITH DIFFERENTIAL/PLATELET
Abs Immature Granulocytes: 0.02 10*3/uL (ref 0.00–0.07)
Basophils Absolute: 0 10*3/uL (ref 0.0–0.1)
Basophils Relative: 0 %
Eosinophils Absolute: 0.3 10*3/uL (ref 0.0–0.5)
Eosinophils Relative: 4 %
HCT: 37.1 % (ref 36.0–46.0)
Hemoglobin: 11.8 g/dL — ABNORMAL LOW (ref 12.0–15.0)
Immature Granulocytes: 0 %
Lymphocytes Relative: 26 %
Lymphs Abs: 2.4 10*3/uL (ref 0.7–4.0)
MCH: 28.2 pg (ref 26.0–34.0)
MCHC: 31.8 g/dL (ref 30.0–36.0)
MCV: 88.8 fL (ref 80.0–100.0)
Monocytes Absolute: 0.7 10*3/uL (ref 0.1–1.0)
Monocytes Relative: 7 %
Neutro Abs: 5.7 10*3/uL (ref 1.7–7.7)
Neutrophils Relative %: 63 %
Platelets: 311 10*3/uL (ref 150–400)
RBC: 4.18 MIL/uL (ref 3.87–5.11)
RDW: 15.4 % (ref 11.5–15.5)
WBC: 9.1 10*3/uL (ref 4.0–10.5)
nRBC: 0 % (ref 0.0–0.2)

## 2021-09-15 LAB — COMPREHENSIVE METABOLIC PANEL
ALT: 15 U/L (ref 0–44)
AST: 16 U/L (ref 15–41)
Albumin: 3.5 g/dL (ref 3.5–5.0)
Alkaline Phosphatase: 56 U/L (ref 38–126)
Anion gap: 7 (ref 5–15)
BUN: 5 mg/dL — ABNORMAL LOW (ref 6–20)
CO2: 20 mmol/L — ABNORMAL LOW (ref 22–32)
Calcium: 8.5 mg/dL — ABNORMAL LOW (ref 8.9–10.3)
Chloride: 110 mmol/L (ref 98–111)
Creatinine, Ser: 0.71 mg/dL (ref 0.44–1.00)
GFR, Estimated: >60 mL/min (ref >60)
Glucose, Bld: 89 mg/dL (ref 70–99)
Potassium: 3.5 mmol/L (ref 3.5–5.1)
Sodium: 137 mmol/L (ref 135–145)
Total Bilirubin: 0.3 mg/dL (ref 0.3–1.2)
Total Protein: 6.8 g/dL (ref 6.5–8.1)

## 2021-09-15 NOTE — ED Notes (Signed)
Patient called multiple times, did not answer.

## 2021-10-21 ENCOUNTER — Other Ambulatory Visit: Payer: Self-pay

## 2021-10-21 ENCOUNTER — Emergency Department (HOSPITAL_COMMUNITY): Payer: Medicaid Other

## 2021-10-21 ENCOUNTER — Emergency Department (HOSPITAL_COMMUNITY)
Admission: EM | Admit: 2021-10-21 | Discharge: 2021-10-22 | Disposition: A | Discharge status: Home / Self Care | Payer: Medicaid Other | Attending: Emergency Medicine | Admitting: Emergency Medicine

## 2021-10-21 DIAGNOSIS — R002 Palpitations: Secondary | ICD-10-CM | POA: Insufficient documentation

## 2021-10-21 DIAGNOSIS — F17210 Nicotine dependence, cigarettes, uncomplicated: Secondary | ICD-10-CM | POA: Insufficient documentation

## 2021-10-21 DIAGNOSIS — N9489 Other specified conditions associated with female genital organs and menstrual cycle: Secondary | ICD-10-CM | POA: Insufficient documentation

## 2021-10-21 DIAGNOSIS — R1110 Vomiting, unspecified: Secondary | ICD-10-CM | POA: Diagnosis not present

## 2021-10-21 DIAGNOSIS — R0602 Shortness of breath: Secondary | ICD-10-CM | POA: Diagnosis not present

## 2021-10-21 DIAGNOSIS — R072 Precordial pain: Secondary | ICD-10-CM | POA: Diagnosis not present

## 2021-10-21 LAB — BASIC METABOLIC PANEL
Anion gap: 12 (ref 5–15)
BUN: 6 mg/dL (ref 6–20)
CO2: 21 mmol/L — ABNORMAL LOW (ref 22–32)
Calcium: 9 mg/dL (ref 8.9–10.3)
Chloride: 104 mmol/L (ref 98–111)
Creatinine, Ser: 0.88 mg/dL (ref 0.44–1.00)
GFR, Estimated: >60 mL/min (ref >60)
Glucose, Bld: 92 mg/dL (ref 70–99)
Potassium: 3.8 mmol/L (ref 3.5–5.1)
Sodium: 137 mmol/L (ref 135–145)

## 2021-10-21 LAB — CBC
HCT: 37.5 % (ref 36.0–46.0)
Hemoglobin: 12 g/dL (ref 12.0–15.0)
MCH: 28.1 pg (ref 26.0–34.0)
MCHC: 32 g/dL (ref 30.0–36.0)
MCV: 87.8 fL (ref 80.0–100.0)
Platelets: 347 10*3/uL (ref 150–400)
RBC: 4.27 MIL/uL (ref 3.87–5.11)
RDW: 13.9 % (ref 11.5–15.5)
WBC: 9.2 10*3/uL (ref 4.0–10.5)
nRBC: 0 % (ref 0.0–0.2)

## 2021-10-21 LAB — TROPONIN I (HIGH SENSITIVITY): Troponin I (High Sensitivity): 3 ng/L (ref <18)

## 2021-10-21 NOTE — ED Triage Notes (Signed)
Pt c/o chest pain for the past hour. 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 Pt states it feels like butterflies in her chest.

## 2021-10-21 NOTE — ED Provider Notes (Signed)
Emergency Medicine Provider Triage Evaluation Note  Shannon Galloway , a 25 y.o. female  was evaluated in triage.  Pt complains of chest pain and pain in the left arm.  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  She states that she has had heart pains on and off recently.  She initially reports she had a heart attack, chart review shows that in 2018 when she was referring to she was admitted, on a 30-day heart monitor where they did not find evidence of Brugada for multiple syncopal events and I do not see mentions that she had an MI.  She currently describes her chest pains as butterflies in her chest.  She endorses being very anxious.   Review of Systems  Positive: Chest pain, anxious Negative: syncope  Physical Exam  BP (!) 114/53   Pulse (!) 110   Temp 98.1 F (36.7 C) (Oral)   Resp (!) 22   Ht 5\' 2"  (1.575 m)   Wt 90.7 kg   SpO2 100%   BMI 36.58 kg/m  Gen:   Awake, very anxious  Resp:  Normal effort  MSK:   Moves extremities without difficulty  Other:  Awake and alert.  Speech is not slurred.  Medical Decision Making  Medically screening exam initiated at 9:15 PM.  Appropriate orders placed.  Shannon Galloway was informed that the remainder of the evaluation will be completed by another provider, this initial triage assessment does not replace that evaluation, and the importance of remaining in the ED until their evaluation is complete.  Note: Portions of this report may have been transcribed using voice recognition software. Every effort was made to ensure accuracy; however, inadvertent computerized transcription errors may be present    Shannon Galloway 10/21/21 2124    2125, MD 10/21/21 [phone]

## 2021-10-22 LAB — D-DIMER, QUANTITATIVE: D-Dimer, Quant: <0.27 ug/mL-FEU (ref 0.00–0.50)

## 2021-10-22 LAB — I-STAT BETA HCG BLOOD, ED (MC, WL, AP ONLY): I-stat hCG, quantitative: <5 m[IU]/mL (ref <5)

## 2021-10-22 LAB — TROPONIN I (HIGH SENSITIVITY): Troponin I (High Sensitivity): 3 ng/L (ref <18)

## 2021-10-22 NOTE — Discharge Instructions (Signed)

## 2021-10-22 NOTE — ED Notes (Signed)
RN reviewed discharge instructions w/ pt. Follow up reviewed, pt had no further questions 

## 2021-10-22 NOTE — ED Provider Notes (Signed)
[facility] EMERGENCY DEPARTMENT Provider Note   CSN: [id number] Arrival date & time: 10/21/21  2103     History Chief Complaint  Patient presents with   Chest Pain    Shannon Galloway is a 25 y.o. female.   Chest Pain Associated symptoms: palpitations, shortness of breath and vomiting   Associated symptoms: no fever    HPI: A 25 year old patient with a history of obesity presents for evaluation of chest pain. Initial onset of pain was approximately 3-6 hours ago. The patient's chest pain is not worse with exertion. The patient's chest pain is middle- or left-sided, is not well-localized, is not described as heaviness/pressure/tightness, is not sharp and does radiate to the arms/jaw/neck. The patient does not complain of nausea and denies diaphoresis. The patient has smoked in the past 90 days and has a family history of coronary artery disease in a first-degree relative with onset less than age 80. The patient has no history of stroke, has no history of peripheral artery disease, denies any history of treated diabetes, is not hypertensive and has no history of hypercholesterolemia.  Patient with history of lupus presents with chest pain.  She reports left-sided sharp chest pain that moves into her left shoulder.  She reports it is sometimes worse with deep breathing.  She reports shortness of breath.  No hemoptysis.  She has had some vomiting. She reports she is not currently on therapy for lupus  Past Medical History:  Diagnosis Date   Chronic headaches    Lupus (HCC)    Neck pain    Syncope    a. unclear etilogy. 10/2016: cardiac MRI and echo unremarkable.     Patient Active Problem List   Diagnosis Date Noted   Bipolar 2 disorder, major depressive episode (HCC) 06/05/2017   Suicide attempt by hanging (HCC) 06/05/2017   Cocaine use disorder, moderate, dependence (HCC) 06/05/2017   Amphetamine use disorder, moderate (HCC) 06/05/2017   Cannabis use  disorder, moderate, dependence (HCC) 06/05/2017   Sedative, hypnotic or anxiolytic use disorder, severe, dependence (HCC) 06/05/2017   Tobacco use disorder 06/05/2017   Syncope 11/22/2016   Depression 12/22/2015   Nexplanon insertion 06/10/2013   General counseling and advice on female contraception 06/01/2013    Past Surgical History:  Procedure Laterality Date   KNEE SURGERY Right 2011   TONSILLECTOMY Bilateral      OB History     Gravida  0   Para  0   Term  0   Preterm  0   AB  0   Living  0      SAB  0   IAB  0   Ectopic  0   Multiple  0   Live Births              Family History  Adopted: Yes  Problem Relation Age of Onset   Heart failure Father        Died at 36   Bipolar disorder Mother    Pneumonia Mother        Died at 93    Social History   Tobacco Use   Smoking status: Every Day    Packs/day: 0.01    Years: 0.01    Pack years: 0.00    Types: Cigarettes   Smokeless tobacco: Never  Vaping Use   Vaping Use: Never used  Substance Use Topics   Alcohol use: Yes    Comment: Once a week.    Drug  use: Yes    Types: Marijuana    Comment: Last used: 2 weeks ago    Home Medications Prior to Admission medications   Medication Sig Start Date End Date Taking? Authorizing Provider  acetaminophen (TYLENOL) 500 MG tablet Take 1,000 mg by mouth every 6 (six) hours as needed for mild pain or headache.    [provider]  hydroxychloroquine (PLAQUENIL) 200 MG tablet Take 400 mg by mouth daily. 11/08/17   [provider]  LORazepam (ATIVAN) 1 MG tablet Take 1 tablet (1 mg total) by mouth 2 (two) times daily as needed for anxiety. 12/17/17   Elpidio Anis, PA-C  Oxcarbazepine (TRILEPTAL) 300 MG tablet Take 1 tablet (300 mg total) by mouth 2 (two) times daily. Patient not taking: Reported on 12/17/2017 06/08/17   Pucilowska, Braulio Conte B, MD  QUEtiapine (SEROQUEL) 200 MG tablet Take 1 tablet (200 mg total) by mouth at  bedtime. Patient not taking: Reported on 12/17/2017 06/08/17   Shari Prows, MD    Allergies    Patient has no known allergies.  Review of Systems   Review of Systems  Constitutional:  Negative for fever.  Respiratory:  Positive for shortness of breath.   Cardiovascular:  Positive for chest pain and palpitations.  Gastrointestinal:  Positive for vomiting.  All other systems reviewed and are negative.  Physical Exam Updated Vital Signs BP 131/80 (BP Location: Left Arm)   Pulse 80   Temp 98.1 F (36.7 C) (Oral)   Resp 15   Ht 1.575 m (5\' 2" )   Wt 90.7 kg   SpO2 98%   BMI 36.58 kg/m   Physical Exam CONSTITUTIONAL: Well developed/well nourished HEAD: Normocephalic/atraumatic EYES: EOMI/PERRL ENMT: Mucous membranes moist NECK: supple no meningeal signs SPINE/BACK:entire spine nontender CV: S1/S2 noted, no murmurs/rubs/gallops noted LUNGS: Lungs are clear to auscultation bilaterally, no apparent distress ABDOMEN: soft, nontender, no rebound or guarding, bowel sounds noted throughout abdomen GU:no cva tenderness NEURO: Pt is awake/alert/appropriate, moves all extremitiesx4.  No facial droop.   EXTREMITIES: pulses normal/equalx4, full ROM, no calf tenderness SKIN: warm, color normal PSYCH: no abnormalities of mood noted, alert and oriented to situation  ED Results / Procedures / Treatments   Labs (all labs ordered are listed, but only abnormal results are displayed) Labs Reviewed  BASIC METABOLIC PANEL - Abnormal; Notable for the following components:      Result Value   CO2 21 (*)    All other components within normal limits  CBC  D-DIMER, QUANTITATIVE  I-STAT BETA HCG BLOOD, ED ([facility], [facility], [facility] ONLY)  TROPONIN I (HIGH SENSITIVITY)  TROPONIN I (HIGH SENSITIVITY)    EKG EKG Interpretation  Date/Time:  Saturday October 21 2021 21:12:28 EDT Ventricular Rate:  104 PR Interval:  120 QRS Duration: 84 QT Interval:  334 QTC Calculation: 439 R Axis:   81 Text  Interpretation: Sinus tachycardia Otherwise normal ECG Confirmed by [id number] ([name]) on 10/22/2021 12:10:06 AM  Radiology DG Chest 2 View  Result Date: 10/21/2021 CLINICAL DATA:  Chest pain. EXAM: CHEST - 2 VIEW COMPARISON:  Chest radiograph dated 02/12/2021. FINDINGS: The heart size and mediastinal contours are within normal limits. Both lungs are clear. The visualized skeletal structures are unremarkable. IMPRESSION: No active cardiopulmonary disease. Electronically Signed   By: 02/14/2021 M.D.   On: 10/21/2021 21:51    Procedures Procedures   Medications Ordered in ED Medications - No data to display  ED Course  I have reviewed the triage vital signs and  the nursing notes.  Pertinent labs & imaging results that were available during my care of the patient were reviewed by me and considered in my medical decision making (see chart for details).    MDM Rules/Calculators/A&P HEAR Score: 3                       This patient presents to the ED for concern of chest pain, this involves an extensive number of treatment options, and is a complaint that carries with it a high risk of complications and morbidity.  The differential diagnosis includes acute coronary syndrome, pulmonary embolism, pneumonia, pericarditis, pneumothorax   Lab Tests:  I Ordered, reviewed, and interpreted labs, which included electrolytes, D-dimer, troponin, complete blood count    I ordered imaging studies which included chest x-ray  I independently visualized and interpreted imaging which showed no acute finding  Additional history obtained:   Previous records obtained and reviewed    Reevaluation:  After the interventions stated above, I reevaluated the patient and found patient is improved    D-dimer negative, low suspicion for PE.  Vital signs of improved Low suspicion for ACS at this time.  Patient is appropriate for discharge home. Given her appearance, low suspicion for acute  aortic dissection Final Clinical Impression(s) / ED Diagnoses Final diagnoses:  Precordial pain    Rx / DC Orders ED Discharge Orders     None        [name], MD 10/22/21 0128

## 2021-10-26 ENCOUNTER — Ambulatory Visit: Payer: Medicaid Other | Admitting: Cardiology

## 2021-10-26 NOTE — Progress Notes (Deleted)
Electrophysiology Office Note   Date:  10/26/2021   ID:  Shannon Galloway, DOB 1996-09-22, MRN [id number]  PCP:  Patient, No Pcp Per (Inactive) Primary Electrophysiologist:  Kerolos Nehme Jorja Loa, MD    No chief complaint on file.    History of Present Illness: Shannon Galloway is a 25 y.o. female who presents today for electrophysiology evaluation.     She has a past history significant for syncope.  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  She apparently had an abnormal ECG at the time, though telemetry and a subsequent ECGs were normal.  She underwent cardiac MRI that showed no evidence of infiltrative disease.  Flecainide challenge was negative for Brugada syndrome.  She wore a 30-day monitor that showed no arrhythmias.  Today, denies symptoms of palpitations, chest pain, shortness of breath, orthopnea, PND, lower extremity edema, claudication, dizziness, presyncope, syncope, bleeding, or neurologic sequela. The patient is tolerating medications without difficulties. ***    Past Medical History:  Diagnosis Date   Chronic headaches    Lupus (HCC)    Neck pain    Syncope    a. unclear etilogy. 10/2016: cardiac MRI and echo unremarkable.    Past Surgical History:  Procedure Laterality Date   KNEE SURGERY Right 2011   TONSILLECTOMY Bilateral      Current Outpatient Medications  Medication Sig Dispense Refill   acetaminophen (TYLENOL) 500 MG tablet Take 1,000 mg by mouth every 6 (six) hours as needed for mild pain or headache.     hydroxychloroquine (PLAQUENIL) 200 MG tablet Take 400 mg by mouth daily.  5   LORazepam (ATIVAN) 1 MG tablet Take 1 tablet (1 mg total) by mouth 2 (two) times daily as needed for anxiety. 4 tablet 0   Oxcarbazepine (TRILEPTAL) 300 MG tablet Take 1 tablet (300 mg total) by mouth 2 (two) times daily. (Patient not taking: Reported on 12/17/2017) 180 tablet 1   QUEtiapine (SEROQUEL) 200 MG tablet Take 1 tablet (200 mg total)  by mouth at bedtime. (Patient not taking: Reported on 12/17/2017) 90 tablet 1   No current facility-administered medications for this visit.    Allergies:   Patient has no known allergies.   Social History:  The patient  reports that she has been smoking cigarettes. She has been smoking an average 0.01 packs per day for the past 0.01 years. She has never used smokeless tobacco. She reports current alcohol use. She reports current drug use. Drug: Marijuana.   Family History:  The patient's family history includes Bipolar disorder in her mother; Heart failure in her father; Pneumonia in her mother. She was adopted.   ROS:  Please see the history of present illness.   Otherwise, review of systems is positive for none.   All other systems are reviewed and negative.   PHYSICAL EXAM: VS:  There were no vitals taken for this visit. , BMI There is no height or weight on file to calculate BMI. GEN: Well nourished, well developed, in no acute distress  HEENT: normal  Neck: no JVD, carotid bruits, or masses Cardiac: ***RRR; no murmurs, rubs, or gallops,no edema  Respiratory:  clear to auscultation bilaterally, normal work of breathing GI: soft, nontender, nondistended, + BS MS: no deformity or atrophy  Skin: warm and dry Neuro:  Strength and sensation are intact Psych: euthymic mood, full affect  EKG:  EKG {ACTION; IS/IS [id number]:[id number]} ordered today. Personal review of the ekg ordered *** shows ***  Recent Labs: 09/14/2021: ALT 15 10/21/2021: BUN 6; Creatinine, Ser 0.88; Hemoglobin 12.0; Platelets 347; Potassium 3.8; Sodium 137    Lipid Panel     Component Value Date/Time   CHOL 151 06/06/2017 0649   TRIG 91 06/06/2017 0649   HDL 38 (L) 06/06/2017 0649   CHOLHDL 4.0 06/06/2017 0649   VLDL 18 06/06/2017 0649   LDLCALC 95 06/06/2017 0649     Wt Readings from Last 3 Encounters:  10/21/21 200 lb (90.7 kg)  09/14/21 200 lb (90.7 kg)  12/17/17 185 lb (83.9 kg)      Other studies  Reviewed: Additional studies/ records that were reviewed today include: Telmetry monitor 01/08/17, CMRI 11/23/16 Review of the above records today demonstrates:  The high average heart rate seen for the monitored period was 160 BPM. The low average heart rate seen for the monitored period was 50 BPM. Sinus rhythm with sinus tachycardia seen. No arrhythmia or pauses seen. No obvious cause for syncope.  1. Normal left ventricular size, thickness and systolic function (LVEF = 61%) with no regional wall motion abnormalities.   2. Normal right ventricular size, thickness and systolic function (LVEF = 59%) with no regional wall motion abnormalities.   3.  Normal left and right atrial size.   4. Normal size of the aortic root, thoracic aorta and pulmonary artery.   5. No late gadolinium enhancement in the left or right ventricular myocardium.   6. Normal pericardium. Trivial amount of inferiorly located pericardial effusion.   ASSESSMENT AND PLAN:  1.  Syncope: Patient has had an exhaustive work-up for her episode of syncope.  She had a flecainide challenge was negative for Brugada syndrome.  Cardiac MRI did not show any evidence of infiltrative disease.  She wore a 30-day monitor without further episodes.  Current medicines are reviewed at length with the patient today.   The patient does not have concerns regarding her medicines.  The following changes were made today:  ***  Labs/ tests ordered today include:  No orders of the defined types were placed in this encounter.    Disposition:   FU with Jalil Lorusso *** months  Signed, Scarlett Portlock Jorja Loa, MD  10/26/2021 7:34 AM     [facility] [address] [place] [address] [phone] (office) [phone] (fax)

## 2022-05-11 ENCOUNTER — Emergency Department (HOSPITAL_COMMUNITY)
Admission: EM | Admit: 2022-05-11 | Discharge: 2022-05-11 | Disposition: A | Discharge status: Home / Self Care | Payer: Medicaid Other | Attending: Emergency Medicine | Admitting: Emergency Medicine

## 2022-05-11 ENCOUNTER — Encounter (HOSPITAL_COMMUNITY): Payer: Self-pay | Admitting: Emergency Medicine

## 2022-05-11 ENCOUNTER — Other Ambulatory Visit: Payer: Self-pay

## 2022-05-11 ENCOUNTER — Emergency Department (HOSPITAL_COMMUNITY): Payer: Medicaid Other

## 2022-05-11 DIAGNOSIS — R1031 Right lower quadrant pain: Secondary | ICD-10-CM | POA: Insufficient documentation

## 2022-05-11 HISTORY — DX: Postural orthostatic tachycardia syndrome (POTS): G90.A

## 2022-05-11 LAB — CBC
HCT: 37.1 % (ref 36.0–46.0)
Hemoglobin: 12 g/dL (ref 12.0–15.0)
MCH: 27.4 pg (ref 26.0–34.0)
MCHC: 32.3 g/dL (ref 30.0–36.0)
MCV: 84.7 fL (ref 80.0–100.0)
Platelets: 277 10*3/uL (ref 150–400)
RBC: 4.38 MIL/uL (ref 3.87–5.11)
RDW: 13.2 % (ref 11.5–15.5)
WBC: 8.3 10*3/uL (ref 4.0–10.5)
nRBC: 0 % (ref 0.0–0.2)

## 2022-05-11 LAB — PREGNANCY, URINE: Preg Test, Ur: NEGATIVE

## 2022-05-11 LAB — COMPREHENSIVE METABOLIC PANEL
ALT: 23 U/L (ref 0–44)
AST: 17 U/L (ref 15–41)
Albumin: 4 g/dL (ref 3.5–5.0)
Alkaline Phosphatase: 52 U/L (ref 38–126)
Anion gap: 5 (ref 5–15)
BUN: 11 mg/dL (ref 6–20)
CO2: 21 mmol/L — ABNORMAL LOW (ref 22–32)
Calcium: 8.5 mg/dL — ABNORMAL LOW (ref 8.9–10.3)
Chloride: 108 mmol/L (ref 98–111)
Creatinine, Ser: 0.7 mg/dL (ref 0.44–1.00)
GFR, Estimated: >60 mL/min (ref >60)
Glucose, Bld: 124 mg/dL — ABNORMAL HIGH (ref 70–99)
Potassium: 3.7 mmol/L (ref 3.5–5.1)
Sodium: 134 mmol/L — ABNORMAL LOW (ref 135–145)
Total Bilirubin: 0.5 mg/dL (ref 0.3–1.2)
Total Protein: 7.6 g/dL (ref 6.5–8.1)

## 2022-05-11 LAB — URINALYSIS, ROUTINE W REFLEX MICROSCOPIC
Bilirubin Urine: NEGATIVE
Glucose, UA: NEGATIVE mg/dL
Hgb urine dipstick: NEGATIVE
Ketones, ur: NEGATIVE mg/dL
Nitrite: NEGATIVE
Protein, ur: NEGATIVE mg/dL
Specific Gravity, Urine: 1.019 (ref 1.005–1.030)
pH: 6 (ref 5.0–8.0)

## 2022-05-11 LAB — LIPASE, BLOOD: Lipase: 32 U/L (ref 11–51)

## 2022-05-11 MED ORDER — IBUPROFEN 600 MG PO TABS: 600.0000 mg | ORAL_TABLET | Freq: Three times a day (TID) | ORAL | 0 refills | Status: DC

## 2022-05-11 MED ORDER — FENTANYL CITRATE PF 50 MCG/ML IJ SOSY
50.0000 ug | PREFILLED_SYRINGE | Freq: Once | INTRAMUSCULAR | Status: AC
  Administered 2022-05-11: 50 ug via INTRAVENOUS
  Filled 2022-05-11: qty 1

## 2022-05-11 MED ORDER — IOHEXOL 300 MG/ML  SOLN
100.0000 mL | Freq: Once | INTRAMUSCULAR | Status: AC | PRN
  Administered 2022-05-11: 100 mL via INTRAVENOUS

## 2022-05-11 MED ORDER — KETOROLAC TROMETHAMINE 30 MG/ML IJ SOLN
15.0000 mg | Freq: Once | INTRAMUSCULAR | Status: AC
  Administered 2022-05-11: 15 mg via INTRAVENOUS
  Filled 2022-05-11: qty 1

## 2022-05-11 MED ORDER — MORPHINE SULFATE (PF) 4 MG/ML IV SOLN
4.0000 mg | Freq: Once | INTRAVENOUS | Status: AC
  Administered 2022-05-11: 4 mg via INTRAVENOUS
  Filled 2022-05-11: qty 1

## 2022-05-11 MED ORDER — HYDROCODONE-ACETAMINOPHEN 5-325 MG PO TABS
1.0000 | ORAL_TABLET | Freq: Four times a day (QID) | ORAL | 0 refills | Status: DC | PRN
Start: 2022-05-11 — End: 2023-10-23

## 2022-05-11 MED ORDER — ONDANSETRON HCL 4 MG/2ML IJ SOLN
4.0000 mg | Freq: Once | INTRAMUSCULAR | Status: AC
  Administered 2022-05-11: 4 mg via INTRAVENOUS
  Filled 2022-05-11: qty 2

## 2022-05-11 NOTE — Discharge Instructions (Addendum)
As discussed, I suspect that your pain was secondary to an ovarian cyst which appears to have ruptured which would explain your increased pain today.  This should resolve spontaneously.  You have been prescribed some medication however to help you with the pain if it persists.  Do not drive within 4 hours of taking hydrocodone as this medication will make you drowsy. ?

## 2022-05-11 NOTE — ED Triage Notes (Signed)
Pt c/o RT lower abdominal pain that began today, however, last Saturday pt was seen in [facility] ED for abdominal pain and pt states she was told her intestines are inflamed and infected. She is on day 7 on antibiotics. States pain did improved, but today it came back 10x worse than last week. Endorses n/v/d with bright red bloody stools. Pt states that pain is so bad she almost passed out. Also on a liquid diet per ED last week. ?

## 2022-05-11 NOTE — ED Notes (Signed)
Patient transported to CT 

## 2022-05-14 NOTE — ED Provider Notes (Signed)
?[facility] ?Provider Note ? ? ?CSN: [id number] ?Arrival date & time: 05/11/22  1423 ? ?  ? ?History ? ?Chief Complaint  ?Patient presents with  ? Abdominal Pain  ? ? ?Shannon Galloway is a 26 y.o. female with a past medical history significant for lupus and POTS and prior h/o of etoh abuse (last drank more than 5 weeks ago), presenting with persistent RLQ abdominal pain which started around 05/03/22.  She describes having persistent stabbing pain in this location in association with having passage of blood in her stool x 1 the day her sx started.  There have been no n/v/d, also no fevers, chills, dysuria.  She was seen at [facility] on 5/6 and was diagnosed with enteritis of the distal ilium, tx with bentyl and flagyl and felt improved until her pain flared up again today, again in the RLQ and accompanied by n/v, but no further complaint of blood per rectum. She is not sexually active, denies risks for std's.  She has had no fevers or chills either.  She has found no alleviators for her sx. ? ?The history is provided by the patient.  ? ?  ? ?Home Medications ?Prior to Admission medications   ?Medication Sig Start Date End Date Taking? Authorizing Provider  ?acetaminophen (TYLENOL) 500 MG tablet Take 1,000 mg by mouth every 6 (six) hours as needed for mild pain or headache.   Yes [provider]  ?dicyclomine (BENTYL) 20 MG tablet Take 20 mg by mouth 2 (two) times daily. 05/05/22  Yes [provider]  ?HYDROcodone-acetaminophen (NORCO/VICODIN) 5-325 MG tablet Take 1 tablet by mouth every 6 (six) hours as needed. 05/11/22  Yes Delmas Faucett, Almyra Free, PA-C  ?ibuprofen (ADVIL) 600 MG tablet Take 1 tablet (600 mg total) by mouth 3 (three) times daily. 05/11/22  Yes Lucette Kratz, Almyra Free, PA-C  ?metroNIDAZOLE (FLAGYL) 500 MG tablet Take 500 mg by mouth 2 (two) times daily. 05/05/22  Yes [provider]  ?LORazepam (ATIVAN) 1 MG tablet Take 1 tablet (1 mg total) by mouth 2 (two) times daily as  needed for anxiety. ?Patient not taking: Reported on 05/11/2022 12/17/17   Charlann Lange, PA-C  ?Oxcarbazepine (TRILEPTAL) 300 MG tablet Take 1 tablet (300 mg total) by mouth 2 (two) times daily. ?Patient not taking: Reported on 12/17/2017 06/08/17   Pucilowska, Wardell Honour, MD  ?QUEtiapine (SEROQUEL) 200 MG tablet Take 1 tablet (200 mg total) by mouth at bedtime. ?Patient not taking: Reported on 12/17/2017 06/08/17   Clovis Fredrickson, MD  ?   ? ?Allergies    ?Patient has no known allergies.   ? ?Review of Systems   ?Review of Systems  ?Constitutional:  Negative for chills and fever.  ?HENT:  Negative for congestion and sore throat.   ?Eyes: Negative.   ?Respiratory:  Negative for chest tightness and shortness of breath.   ?Cardiovascular:  Negative for chest pain.  ?Gastrointestinal:  Positive for abdominal pain, nausea and vomiting. Negative for constipation and diarrhea.  ?Genitourinary: Negative.   ?Musculoskeletal:  Negative for arthralgias, joint swelling and neck pain.  ?Skin: Negative.  Negative for rash and wound.  ?Neurological:  Negative for dizziness, weakness, light-headedness, numbness and headaches.  ?Psychiatric/Behavioral: Negative.    ? ?Physical Exam ?Updated Vital Signs ?BP (!) 118/58   Pulse 74   Temp 98.4 ?F (36.9 ?C) (Oral)   Resp 16   Ht 5\' 2"  (1.575 m)   Wt 99.8 kg   LMP 04/20/2022 (Approximate)   SpO2  100%   BMI 40.24 kg/m?  ?Physical Exam ?Vitals and nursing note reviewed.  ?Constitutional:   ?   Appearance: She is well-developed.  ?HENT:  ?   Head: Normocephalic and atraumatic.  ?Eyes:  ?   Conjunctiva/sclera: Conjunctivae normal.  ?Cardiovascular:  ?   Rate and Rhythm: Normal rate and regular rhythm.  ?   Heart sounds: Normal heart sounds.  ?Pulmonary:  ?   Effort: Pulmonary effort is normal.  ?   Breath sounds: Normal breath sounds. No wheezing.  ?Abdominal:  ?   General: Bowel sounds are normal.  ?   Palpations: Abdomen is soft.  ?   Tenderness: There is abdominal tenderness in  the right lower quadrant. There is no guarding or rebound. Negative signs include McBurney's sign and psoas sign.  ?Musculoskeletal:     ?   General: Normal range of motion.  ?   Cervical back: Normal range of motion.  ?Skin: ?   General: Skin is warm and dry.  ?Neurological:  ?   General: No focal deficit present.  ?   Mental Status: She is alert.  ? ? ?ED Results / Procedures / Treatments   ?Labs ?(all labs ordered are listed, but only abnormal results are displayed) ?Labs Reviewed  ?COMPREHENSIVE METABOLIC PANEL - Abnormal; Notable for the following components:  ?    Result Value  ? Sodium 134 (*)   ? CO2 21 (*)   ? Glucose, Bld 124 (*)   ? Calcium 8.5 (*)   ? All other components within normal limits  ?URINALYSIS, ROUTINE W REFLEX MICROSCOPIC - Abnormal; Notable for the following components:  ? APPearance HAZY (*)   ? Leukocytes,Ua TRACE (*)   ? Bacteria, UA RARE (*)   ? All other components within normal limits  ?LIPASE, BLOOD  ?CBC  ?PREGNANCY, URINE  ? ? ?EKG ?None ? ?Radiology ?CT ABDOMEN PELVIS W CONTRAST ? ?Result Date: 05/11/2022 ?CLINICAL DATA:  Pain right lower quadrant of abdomen x1 week EXAM: CT ABDOMEN AND PELVIS WITH CONTRAST TECHNIQUE: Multidetector CT imaging of the abdomen and pelvis was performed using the standard protocol following bolus administration of intravenous contrast. RADIATION DOSE REDUCTION: This exam was performed according to the departmental dose-optimization program which includes automated exposure control, adjustment of the mA and/or kV according to patient size and/or use of iterative reconstruction technique. CONTRAST:  149mL OMNIPAQUE IOHEXOL 300 MG/ML  SOLN COMPARISON:  05/05/2022 FINDINGS: Lower chest: Unremarkable. Hepatobiliary: No focal abnormality is seen in the liver. There is no dilation of bile ducts. Gallbladder is unremarkable. Pancreas: No focal abnormality is seen. Spleen: Unremarkable. Adrenals/Urinary Tract: Adrenals are unremarkable. There is no  hydronephrosis. There are no renal or ureteral stones. Urinary bladder is unremarkable. Stomach/Bowel: Stomach is unremarkable. Small bowel loops are not dilated. Appendix is not dilated. There is no significant wall thickening in colon. There is no pericolic stranding. Vascular/Lymphatic: Vascular structures are unremarkable. There are scattered subcentimeter mesenteric nodes, possibly suggesting benign reactive hyperplasia. Reproductive: There are no dominant adnexal masses. Small amount of free fluid is seen in the cul-de-sac. Other: There is no pneumoperitoneum. Musculoskeletal: Unremarkable. IMPRESSION: No acute findings are seen in the CT scan of abdomen and pelvis. There is no evidence of intestinal obstruction or pneumoperitoneum. Appendix is not dilated. There is no hydronephrosis. Small amount of free fluid in the pelvis may be due to recent rupture of ovarian cyst or follicle. Electronically Signed   By: Elmer Picker M.D.   On: 05/11/2022 18:59   ? ? ? ?  Procedures ?Procedures  ? ? ?Medications Ordered in ED ?Medications  ?morphine (PF) 4 MG/ML injection 4 mg (4 mg Intravenous Given 05/11/22 1710)  ?ondansetron [facility]) injection 4 mg (4 mg Intravenous Given 05/11/22 1709)  ?iohexol (OMNIPAQUE) 300 MG/ML solution 100 mL (100 mLs Intravenous Contrast Given 05/11/22 1824)  ?fentaNYL (SUBLIMAZE) injection 50 mcg (50 mcg Intravenous Given 05/11/22 1949)  ?ketorolac (TORADOL) 30 MG/ML injection 15 mg (15 mg Intravenous Given 05/11/22 2136)  ? ? ?ED Course/ Medical Decision Making/ A&P ?  ?                        ?Medical Decision Making ?Pt with 1+ week hx of RLQ pain, improved after abx as tx for enteritis, now returned today with n/v, no fevers, no bowel changes currently, although saw blood in stool first day of sx.  DDX including sbo, worsening enteritis/bowel perforation or abscess, diverticulitis, mesenteric adenitis, appy, PID, ovarian cyst, torsion.   ? ?Pt is tender rlq, but no guarding, deep exam  well tolerated, doubt this represents an appendicitis or ovarian torsion.  She is not sexually active, PID not likely.  She was re-imaged to r/o worsening enteritis/perforation, abscess which is negative for acute intestinal so

## 2022-05-29 ENCOUNTER — Other Ambulatory Visit: Payer: Self-pay

## 2022-05-29 ENCOUNTER — Emergency Department (HOSPITAL_COMMUNITY)
Admission: EM | Admit: 2022-05-29 | Discharge: 2022-05-29 | Disposition: A | Discharge status: Home / Self Care | Payer: Medicaid Other | Attending: Emergency Medicine | Admitting: Emergency Medicine

## 2022-05-29 ENCOUNTER — Encounter (HOSPITAL_COMMUNITY): Payer: Self-pay | Admitting: Emergency Medicine

## 2022-05-29 ENCOUNTER — Emergency Department (HOSPITAL_COMMUNITY): Payer: Medicaid Other

## 2022-05-29 DIAGNOSIS — D649 Anemia, unspecified: Secondary | ICD-10-CM | POA: Insufficient documentation

## 2022-05-29 DIAGNOSIS — R1031 Right lower quadrant pain: Secondary | ICD-10-CM | POA: Diagnosis present

## 2022-05-29 LAB — CBC
HCT: 35.6 % — ABNORMAL LOW (ref 36.0–46.0)
Hemoglobin: 11.5 g/dL — ABNORMAL LOW (ref 12.0–15.0)
MCH: 28 pg (ref 26.0–34.0)
MCHC: 32.3 g/dL (ref 30.0–36.0)
MCV: 86.6 fL (ref 80.0–100.0)
Platelets: 310 10*3/uL (ref 150–400)
RBC: 4.11 MIL/uL (ref 3.87–5.11)
RDW: 13.2 % (ref 11.5–15.5)
WBC: 8.7 10*3/uL (ref 4.0–10.5)
nRBC: 0 % (ref 0.0–0.2)

## 2022-05-29 LAB — COMPREHENSIVE METABOLIC PANEL
ALT: 20 U/L (ref 0–44)
AST: 18 U/L (ref 15–41)
Albumin: 4.1 g/dL (ref 3.5–5.0)
Alkaline Phosphatase: 55 U/L (ref 38–126)
Anion gap: 6 (ref 5–15)
BUN: 13 mg/dL (ref 6–20)
CO2: 25 mmol/L (ref 22–32)
Calcium: 9 mg/dL (ref 8.9–10.3)
Chloride: 106 mmol/L (ref 98–111)
Creatinine, Ser: 0.71 mg/dL (ref 0.44–1.00)
GFR, Estimated: >60 mL/min (ref >60)
Glucose, Bld: 118 mg/dL — ABNORMAL HIGH (ref 70–99)
Potassium: 3.9 mmol/L (ref 3.5–5.1)
Sodium: 137 mmol/L (ref 135–145)
Total Bilirubin: 0.2 mg/dL — ABNORMAL LOW (ref 0.3–1.2)
Total Protein: 7.8 g/dL (ref 6.5–8.1)

## 2022-05-29 LAB — ABO/RH: ABO/RH(D): O POS

## 2022-05-29 LAB — POC URINE PREG, ED: Preg Test, Ur: NEGATIVE

## 2022-05-29 LAB — TYPE AND SCREEN
ABO/RH(D): O POS
Antibody Screen: NEGATIVE

## 2022-05-29 MED ORDER — ONDANSETRON 4 MG PO TBDP: 4.0000 mg | ORAL_TABLET | Freq: Three times a day (TID) | ORAL | 0 refills | Status: DC | PRN

## 2022-05-29 MED ORDER — ONDANSETRON HCL 4 MG/2ML IJ SOLN: 4.0000 mg | Freq: Once | INTRAMUSCULAR | Status: DC

## 2022-05-29 MED ORDER — DICYCLOMINE HCL 20 MG PO TABS: 20.0000 mg | ORAL_TABLET | Freq: Two times a day (BID) | ORAL | 0 refills | Status: DC

## 2022-05-29 MED ORDER — NAPROXEN 375 MG PO TABS: 375.0000 mg | ORAL_TABLET | Freq: Two times a day (BID) | ORAL | 0 refills | Status: DC

## 2022-05-29 MED ORDER — FENTANYL CITRATE PF 50 MCG/ML IJ SOSY
50.0000 ug | PREFILLED_SYRINGE | Freq: Once | INTRAMUSCULAR | Status: AC
  Administered 2022-05-29: 50 ug via INTRAVENOUS
  Filled 2022-05-29: qty 1

## 2022-05-29 MED ORDER — OXYCODONE-ACETAMINOPHEN 5-325 MG PO TABS
1.0000 | ORAL_TABLET | Freq: Once | ORAL | Status: AC
  Administered 2022-05-29: 1 via ORAL
  Filled 2022-05-29: qty 1

## 2022-05-29 MED ORDER — OXYCODONE-ACETAMINOPHEN 5-325 MG PO TABS: 2.0000 | ORAL_TABLET | Freq: Once | ORAL | Status: DC

## 2022-05-29 MED ORDER — HYDROMORPHONE HCL 1 MG/ML IJ SOLN
1.0000 mg | Freq: Once | INTRAMUSCULAR | Status: DC
  Filled 2022-05-29: qty 1

## 2022-05-29 MED ORDER — SODIUM CHLORIDE 0.9 % IV BOLUS
1000.0000 mL | Freq: Once | INTRAVENOUS | Status: AC
  Administered 2022-05-29: 1000 mL via INTRAVENOUS

## 2022-05-29 MED ORDER — ONDANSETRON HCL 4 MG/2ML IJ SOLN
4.0000 mg | Freq: Once | INTRAMUSCULAR | Status: AC
  Administered 2022-05-29: 4 mg via INTRAVENOUS
  Filled 2022-05-29: qty 2

## 2022-05-29 NOTE — Discharge Instructions (Signed)

## 2022-05-29 NOTE — ED Provider Notes (Signed)
Hx of lupus and pots. Here w/ c/o RLQ abdominal pain. Has been seen in the past x 2 and has had CT scans in the past x2.  She has had some free fluid in the pelvis on the right in the past was considered to maybe have a ruptured ovarian cyst was sent home with improvement but returns with worsening right lower quadrant abdominal pain.  Labs and pain medicine ordered.  Will order ultrasound of the pelvis to rule out torsion Physical Exam  BP (!) 112/59   Pulse 87   Temp 98.1 F (36.7 C) (Oral)   Resp 19   Ht 5\' 2"  (1.575 m)   Wt 99.8 kg   LMP 05/04/2022   SpO2 100%   BMI 40.24 kg/m   Physical Exam Vitals and nursing note reviewed.  Constitutional:      General: She is not in acute distress.    Appearance: She is well-developed. She is not diaphoretic.  HENT:     Head: Normocephalic and atraumatic.     Right Ear: External ear normal.     Left Ear: External ear normal.     Nose: Nose normal.     Mouth/Throat:     Mouth: Mucous membranes are moist.  Eyes:     General: No scleral icterus.    Conjunctiva/sclera: Conjunctivae normal.  Cardiovascular:     Rate and Rhythm: Normal rate and regular rhythm.     Heart sounds: Normal heart sounds. No murmur heard.   No friction rub. No gallop.  Pulmonary:     Effort: Pulmonary effort is normal. No respiratory distress.     Breath sounds: Normal breath sounds.  Abdominal:     General: Bowel sounds are normal. There is no distension.     Palpations: Abdomen is soft. There is no mass.     Tenderness: There is abdominal tenderness in the suprapubic area. There is no guarding or rebound.  Musculoskeletal:     Cervical back: Normal range of motion.  Skin:    General: Skin is warm and dry.  Neurological:     Mental Status: She is alert and oriented to person, place, and time.  Psychiatric:        Behavior: Behavior normal.    Procedures  Procedures  ED Course / MDM    Medical Decision Making Amount and/or Complexity of Data  Reviewed Labs: ordered. Radiology: ordered.  Risk Prescription drug management.   26 year old female who has had multiple visits recently for right lower quadrant abdominal pain. I reviewed outside records from previous visit at [facility].  At that time she did have some ileal wall thickening consistent with inflammatory or infectious enteritis.  She completed a course of antibiotics.  She was seen again in this emergency department had no acute findings except for some free fluid in the pelvis with perception that she may have had a ruptured cyst.  I ordered, reviewed and interpreted a pelvic ultrasound which shows no acute findings today.  Does admit she has had intermittent rectal bleeding.  Her hemoglobin is 11.5 today.  And is not significantly changed from her hemoglobin 2 weeks ago.  The remainder of her labs are insignificant.  She has no loose or leukocytosis.  Will discharge with pain medication and GI referral along with antinausea medication.      [facility], PA-C 05/29/22 2150    2151, DO 06/03/22 2327

## 2022-05-29 NOTE — ED Triage Notes (Signed)
Pt having RLQ abdominal pain and rectal bleeding that started today, diagnosed with ruptured ovarian cyst at this ED on 05/11/22.

## 2022-05-29 NOTE — ED Notes (Signed)
Tried to start an iv x 2 attempts, unsuccessful

## 2022-05-29 NOTE — ED Provider Notes (Signed)
[facility] EMERGENCY DEPARTMENT Provider Note   CSN: [id number] Arrival date & time: 05/29/22  1606     History  Chief Complaint  Patient presents with   Abdominal Pain    Shannon Galloway is a 26 y.o. female.  Pt complains of severe right lower abdominal pain.  Pt has a history of the same 2 weeks ago that resolved.  Pt reports seh was seen at [facility] and diagnosed with a bowel infection and then told here that she probably had a ruptured ovarain cyst.  Pt reports she has lupus and is followed at [facility].     Abdominal Pain Pain location:  RLQ     Home Medications Prior to Admission medications   Medication Sig Start Date End Date Taking? Authorizing Provider  acetaminophen (TYLENOL) 500 MG tablet Take 1,000 mg by mouth every 6 (six) hours as needed for mild pain or headache.    [provider]  dicyclomine (BENTYL) 20 MG tablet Take 20 mg by mouth 2 (two) times daily. 05/05/22   [provider]  HYDROcodone-acetaminophen (NORCO/VICODIN) 5-325 MG tablet Take 1 tablet by mouth every 6 (six) hours as needed. 05/11/22   Burgess Amor, PA-C  ibuprofen (ADVIL) 600 MG tablet Take 1 tablet (600 mg total) by mouth 3 (three) times daily. 05/11/22   Idol, Raynelle Fanning, PA-C  LORazepam (ATIVAN) 1 MG tablet Take 1 tablet (1 mg total) by mouth 2 (two) times daily as needed for anxiety. Patient not taking: Reported on 05/11/2022 12/17/17   Elpidio Anis, PA-C  metroNIDAZOLE (FLAGYL) 500 MG tablet Take 500 mg by mouth 2 (two) times daily. 05/05/22   [provider]  Oxcarbazepine (TRILEPTAL) 300 MG tablet Take 1 tablet (300 mg total) by mouth 2 (two) times daily. Patient not taking: Reported on 12/17/2017 06/08/17   Pucilowska, Braulio Conte B, MD  QUEtiapine (SEROQUEL) 200 MG tablet Take 1 tablet (200 mg total) by mouth at bedtime. Patient not taking: Reported on 12/17/2017 06/08/17   Shari Prows, MD      Allergies    Patient has no known allergies.    Review of  Systems   Review of Systems  Gastrointestinal:  Positive for abdominal pain.  All other systems reviewed and are negative.  Physical Exam Updated Vital Signs BP (!) 98/53   Pulse 90   Temp 98.1 F (36.7 C) (Oral)   Resp 20   Ht 5\' 2"  (1.575 m)   Wt 99.8 kg   LMP 05/04/2022   SpO2 99%   BMI 40.24 kg/m  Physical Exam Vitals and nursing note reviewed.  Constitutional:      Appearance: She is well-developed.  HENT:     Head: Normocephalic.  Eyes:     Extraocular Movements: Extraocular movements intact.  Cardiovascular:     Rate and Rhythm: Normal rate and regular rhythm.  Pulmonary:     Effort: Pulmonary effort is normal.  Abdominal:     General: Abdomen is flat. Bowel sounds are normal. There is no distension.     Palpations: Abdomen is soft.     Tenderness: There is abdominal tenderness in the right lower quadrant. There is guarding.  Musculoskeletal:        General: Normal range of motion.     Cervical back: Normal range of motion.  Skin:    General: Skin is warm.  Neurological:     General: No focal deficit present.     Mental Status: She is alert and oriented to person, place,  and time.    ED Results / Procedures / Treatments   Labs (all labs ordered are listed, but only abnormal results are displayed) Labs Reviewed  COMPREHENSIVE METABOLIC PANEL - Abnormal; Notable for the following components:      Result Value   Glucose, Bld 118 (*)    Total Bilirubin 0.2 (*)    All other components within normal limits  CBC - Abnormal; Notable for the following components:   Hemoglobin 11.5 (*)    HCT 35.6 (*)    All other components within normal limits  POC URINE PREG, ED  POC OCCULT BLOOD, ED  TYPE AND SCREEN  ABO/RH    EKG None  Radiology No results found.  Procedures Procedures    Medications Ordered in ED Medications  HYDROmorphone (DILAUDID) injection 1 mg (has no administration in time range)  ondansetron (ZOFRAN) injection 4 mg (has no  administration in time range)    ED Course/ Medical Decision Making/ A&P                           Medical Decision Making Amount and/or Complexity of Data Reviewed Labs: ordered.  Risk Prescription drug management.   Pt given Iv pain medication Dilaudid and zofran for nausea.   Ultrasound ovaries for torsion due to intermittent episodes of severe pain  Pt's care turned to Huntley Dec PA at 7pm         Final Clinical Impression(s) / ED Diagnoses Final diagnoses:  Right lower quadrant abdominal pain    Rx / DC Orders ED Discharge Orders     None         Elson Areas, Cordelia Poche 05/29/22 1906    Franne Forts, DO 06/03/22 2327

## 2022-08-21 ENCOUNTER — Emergency Department (HOSPITAL_COMMUNITY)
Admission: EM | Admit: 2022-08-21 | Discharge: 2022-08-21 | Disposition: A | Discharge status: Home / Self Care | Payer: Medicaid Other | Attending: Emergency Medicine | Admitting: Emergency Medicine

## 2022-08-21 ENCOUNTER — Other Ambulatory Visit: Payer: Self-pay

## 2022-08-21 ENCOUNTER — Encounter (HOSPITAL_COMMUNITY): Payer: Self-pay

## 2022-08-21 DIAGNOSIS — R739 Hyperglycemia, unspecified: Secondary | ICD-10-CM | POA: Insufficient documentation

## 2022-08-21 DIAGNOSIS — R109 Unspecified abdominal pain: Secondary | ICD-10-CM | POA: Diagnosis not present

## 2022-08-21 DIAGNOSIS — D649 Anemia, unspecified: Secondary | ICD-10-CM | POA: Diagnosis not present

## 2022-08-21 DIAGNOSIS — R112 Nausea with vomiting, unspecified: Secondary | ICD-10-CM | POA: Insufficient documentation

## 2022-08-21 DIAGNOSIS — R410 Disorientation, unspecified: Secondary | ICD-10-CM | POA: Insufficient documentation

## 2022-08-21 LAB — CBC
HCT: 37.3 % (ref 36.0–46.0)
Hemoglobin: 11.7 g/dL — ABNORMAL LOW (ref 12.0–15.0)
MCH: 27.1 pg (ref 26.0–34.0)
MCHC: 31.4 g/dL (ref 30.0–36.0)
MCV: 86.3 fL (ref 80.0–100.0)
Platelets: 220 10*3/uL (ref 150–400)
RBC: 4.32 MIL/uL (ref 3.87–5.11)
RDW: 13.6 % (ref 11.5–15.5)
WBC: 9.4 10*3/uL (ref 4.0–10.5)
nRBC: 0 % (ref 0.0–0.2)

## 2022-08-21 LAB — ABO/RH: ABO/RH(D): O POS

## 2022-08-21 LAB — BASIC METABOLIC PANEL
Anion gap: 6 (ref 5–15)
BUN: 12 mg/dL (ref 6–20)
CO2: 21 mmol/L — ABNORMAL LOW (ref 22–32)
Calcium: 8.8 mg/dL — ABNORMAL LOW (ref 8.9–10.3)
Chloride: 110 mmol/L (ref 98–111)
Creatinine, Ser: 0.76 mg/dL (ref 0.44–1.00)
GFR, Estimated: >60 mL/min (ref >60)
Glucose, Bld: 106 mg/dL — ABNORMAL HIGH (ref 70–99)
Potassium: 4 mmol/L (ref 3.5–5.1)
Sodium: 137 mmol/L (ref 135–145)

## 2022-08-21 LAB — HCG, QUANTITATIVE, PREGNANCY: hCG, Beta Chain, Quant, S: <1 m[IU]/mL (ref <5)

## 2022-08-21 MED ORDER — ONDANSETRON HCL 4 MG/2ML IJ SOLN
4.0000 mg | Freq: Once | INTRAMUSCULAR | Status: AC
Start: 2022-08-21 — End: 2022-08-21
  Administered 2022-08-21: 4 mg via INTRAVENOUS
  Filled 2022-08-21: qty 2

## 2022-08-21 MED ORDER — LACTATED RINGERS IV BOLUS
1000.0000 mL | Freq: Once | INTRAVENOUS | Status: AC
  Administered 2022-08-21: 1000 mL via INTRAVENOUS

## 2022-08-21 NOTE — ED Provider Notes (Signed)
[facility]-EMERGENCY DEPT Provider Note   CSN: [id number] Arrival date & time: 08/21/22  1033     History  Chief Complaint  Patient presents with   Abdominal Pain   Emesis   Nausea    Shannon Galloway is a 26 y.o. female.  HPI 26 yo female presents with nausea and vomiting.  Patient with lmp July 16 nml.  Had unprotected sex Aug 4,5, and 6.  Now menses is late.  Had a negative pregnancy test md office on 8/15 then had 3 home pregnancy test faintly positive Sunday and two negative tests on Sunday.  G1A1. Very nauseated and some cramping in pelvis more on right.  Took zofran but hasn't taken.  Last emesis an hour ago.  Has not kept much down except some diet coke today. Patient taking plaquenil for lupus- states she has flare ups of rash and has pots but has not passed out for a long time.  Not taking any meds for pots.     Home Medications Prior to Admission medications   Medication Sig Start Date End Date Taking? Authorizing Provider  dicyclomine (BENTYL) 20 MG tablet Take 1 tablet (20 mg total) by mouth 2 (two) times daily. 05/29/22   Arthor Captain, PA-C  HYDROcodone-acetaminophen (NORCO/VICODIN) 5-325 MG tablet Take 1 tablet by mouth every 6 (six) hours as needed. Patient not taking: Reported on 05/29/2022 05/11/22   Burgess Amor, PA-C  ibuprofen (ADVIL) 600 MG tablet Take 1 tablet (600 mg total) by mouth 3 (three) times daily. 05/11/22   Idol, Raynelle Fanning, PA-C  naproxen (NAPROSYN) 375 MG tablet Take 1 tablet (375 mg total) by mouth 2 (two) times daily with a meal. 05/29/22   Harris, Abigail, PA-C  ondansetron (ZOFRAN-ODT) 4 MG disintegrating tablet Take 1 tablet (4 mg total) by mouth every 8 (eight) hours as needed for nausea or vomiting. 05/29/22   Arthor Captain, PA-C      Allergies    Patient has no known allergies.    Review of Systems   Review of Systems  Physical Exam Updated Vital Signs BP (!) 147/86 (BP Location: Right Arm)   Pulse 77   Temp 98.5  F (36.9 C) (Oral)   Resp 16   Ht 1.575 m (5\' 2" )   Wt 99.8 kg   LMP 07/15/2022   SpO2 100%   BMI 40.24 kg/m  Physical Exam Vitals and nursing note reviewed.  Constitutional:      Appearance: She is well-developed.  HENT:     Head: Normocephalic.     Mouth/Throat:     Mouth: Mucous membranes are moist.  Eyes:     Extraocular Movements: Extraocular movements intact.  Cardiovascular:     Rate and Rhythm: Normal rate and regular rhythm.  Pulmonary:     Effort: Pulmonary effort is normal.     Breath sounds: Normal breath sounds.  Abdominal:     General: Abdomen is flat. Bowel sounds are normal.     Palpations: Abdomen is soft.     Tenderness: There is no abdominal tenderness.  Skin:    General: Skin is warm and dry.     Capillary Refill: Capillary refill takes less than 2 seconds.  Neurological:     General: No focal deficit present.     Mental Status: She is alert. She is disoriented.  Psychiatric:        Mood and Affect: Mood normal.     ED Results / Procedures / Treatments   Labs (  all labs ordered are listed, but only abnormal results are displayed) Labs Reviewed  CBC - Abnormal; Notable for the following components:      Result Value   Hemoglobin 11.7 (*)    All other components within normal limits  BASIC METABOLIC PANEL - Abnormal; Notable for the following components:   CO2 21 (*)    Glucose, Bld 106 (*)    Calcium 8.8 (*)    All other components within normal limits  HCG, QUANTITATIVE, PREGNANCY  I-STAT BETA HCG BLOOD, ED ([facility], [facility], [facility] ONLY)  ABO/RH    EKG None  Radiology No results found.  Procedures Procedures    Medications Ordered in ED Medications  lactated ringers bolus 1,000 mL (1,000 mLs Intravenous New Bag/Given 08/21/22 1134)  ondansetron (ZOFRAN) injection 4 mg (4 mg Intravenous Given 08/21/22 1133)    ED Course/ Medical Decision Making/ A&P Clinical Course as of 08/21/22 1320  Tue Aug 21, 2022  1319 CBC(!) CBC reviewed and  interpreted and significant for mild anemia with hemoglobin 11.7 [DR]  1319 Basic metabolic panel(!) Basic metabolic panel reviewed and interpreted with mild hyperglycemia with glucose of 106 consistent with nonfasting state otherwise within normal limits [DR]  1319 hCG, quantitative, pregnancy Pregnancy test is negative [DR]    Clinical Course User Index [DR] Margarita Grizzle, MD                           Medical Decision Making 26 year old female presents today with concern that she is pregnant.  She was sexually active without any protection several weeks ago and her period is late.  She had an equivocal test at home.  Her abdomen is soft and nontender.  She is having some nausea. Here in the ED she is evaluated with a pregnancy test which is negative and other labs are which are within normal limits  Amount and/or Complexity of Data Reviewed Labs: ordered.  Risk Prescription drug management.          Final Clinical Impression(s) / ED Diagnoses Final diagnoses:  Nausea and vomiting, unspecified vomiting type    Rx / DC Orders ED Discharge Orders     None         Margarita Grizzle, MD 08/21/22 1321

## 2022-08-21 NOTE — ED Triage Notes (Signed)
Pt c/o lower abdominal pain, nausea, and vomiting since last Monday. Reports she had a ruptured ovarian cyst last month. Also reports there is a chance she could be pregnant, had a negative pregnancy test last week but has not started her menstrual cycle. LMP 07/15/22

## 2022-08-21 NOTE — ED Notes (Signed)
Pt ambulated to bathroom o2 and pulse stayed in normal range, no sob and still some nausea.

## 2022-11-15 ENCOUNTER — Encounter (HOSPITAL_BASED_OUTPATIENT_CLINIC_OR_DEPARTMENT_OTHER): Payer: Self-pay | Admitting: Emergency Medicine

## 2022-11-15 ENCOUNTER — Emergency Department (HOSPITAL_BASED_OUTPATIENT_CLINIC_OR_DEPARTMENT_OTHER)
Admission: EM | Admit: 2022-11-15 | Discharge: 2022-11-15 | Disposition: A | Discharge status: Home / Self Care | Payer: Medicaid Other | Attending: Emergency Medicine | Admitting: Emergency Medicine

## 2022-11-15 ENCOUNTER — Emergency Department (HOSPITAL_BASED_OUTPATIENT_CLINIC_OR_DEPARTMENT_OTHER): Payer: Medicaid Other

## 2022-11-15 ENCOUNTER — Other Ambulatory Visit: Payer: Self-pay

## 2022-11-15 ENCOUNTER — Emergency Department (HOSPITAL_BASED_OUTPATIENT_CLINIC_OR_DEPARTMENT_OTHER)
Admission: EM | Admit: 2022-11-15 | Discharge: 2022-11-15 | Discharge status: Against Medical Advice | Payer: Medicaid Other | Source: Home / Self Care

## 2022-11-15 DIAGNOSIS — R509 Fever, unspecified: Secondary | ICD-10-CM | POA: Insufficient documentation

## 2022-11-15 DIAGNOSIS — R059 Cough, unspecified: Secondary | ICD-10-CM | POA: Insufficient documentation

## 2022-11-15 DIAGNOSIS — Z20822 Contact with and (suspected) exposure to covid-19: Secondary | ICD-10-CM | POA: Insufficient documentation

## 2022-11-15 DIAGNOSIS — R0981 Nasal congestion: Secondary | ICD-10-CM | POA: Diagnosis not present

## 2022-11-15 DIAGNOSIS — R600 Localized edema: Secondary | ICD-10-CM | POA: Diagnosis not present

## 2022-11-15 DIAGNOSIS — M545 Low back pain, unspecified: Secondary | ICD-10-CM | POA: Diagnosis not present

## 2022-11-15 DIAGNOSIS — M7989 Other specified soft tissue disorders: Secondary | ICD-10-CM | POA: Diagnosis not present

## 2022-11-15 DIAGNOSIS — Z8616 Personal history of COVID-19: Secondary | ICD-10-CM | POA: Diagnosis not present

## 2022-11-15 LAB — URINALYSIS, ROUTINE W REFLEX MICROSCOPIC
Bilirubin Urine: NEGATIVE
Glucose, UA: NEGATIVE mg/dL
Hgb urine dipstick: NEGATIVE
Ketones, ur: NEGATIVE mg/dL
Leukocytes,Ua: NEGATIVE
Nitrite: NEGATIVE
Protein, ur: NEGATIVE mg/dL
Specific Gravity, Urine: 1.025 (ref 1.005–1.030)
pH: 6.5 (ref 5.0–8.0)

## 2022-11-15 LAB — COMPREHENSIVE METABOLIC PANEL
ALT: 29 U/L (ref 0–44)
AST: 27 U/L (ref 15–41)
Albumin: 3.6 g/dL (ref 3.5–5.0)
Alkaline Phosphatase: 53 U/L (ref 38–126)
Anion gap: 9 (ref 5–15)
BUN: 10 mg/dL (ref 6–20)
CO2: 20 mmol/L — ABNORMAL LOW (ref 22–32)
Calcium: 8.5 mg/dL — ABNORMAL LOW (ref 8.9–10.3)
Chloride: 107 mmol/L (ref 98–111)
Creatinine, Ser: 0.77 mg/dL (ref 0.44–1.00)
GFR, Estimated: >60 mL/min (ref >60)
Glucose, Bld: 118 mg/dL — ABNORMAL HIGH (ref 70–99)
Potassium: 3.6 mmol/L (ref 3.5–5.1)
Sodium: 136 mmol/L (ref 135–145)
Total Bilirubin: 0.5 mg/dL (ref 0.3–1.2)
Total Protein: 7.9 g/dL (ref 6.5–8.1)

## 2022-11-15 LAB — CBC
HCT: 35.9 % — ABNORMAL LOW (ref 36.0–46.0)
Hemoglobin: 11.5 g/dL — ABNORMAL LOW (ref 12.0–15.0)
MCH: 26.1 pg (ref 26.0–34.0)
MCHC: 32 g/dL (ref 30.0–36.0)
MCV: 81.6 fL (ref 80.0–100.0)
Platelets: 332 10*3/uL (ref 150–400)
RBC: 4.4 MIL/uL (ref 3.87–5.11)
RDW: 14.6 % (ref 11.5–15.5)
WBC: 12 10*3/uL — ABNORMAL HIGH (ref 4.0–10.5)
nRBC: 0 % (ref 0.0–0.2)

## 2022-11-15 LAB — RESP PANEL BY RT-PCR (FLU A&B, COVID) ARPGX2
Influenza A by PCR: NEGATIVE
Influenza B by PCR: NEGATIVE
SARS Coronavirus 2 by RT PCR: NEGATIVE

## 2022-11-15 LAB — PREGNANCY, URINE: Preg Test, Ur: NEGATIVE

## 2022-11-15 LAB — BRAIN NATRIURETIC PEPTIDE: B Natriuretic Peptide: 18.4 pg/mL (ref 0.0–100.0)

## 2022-11-15 MED ORDER — NIRMATRELVIR/RITONAVIR (PAXLOVID)TABLET: 3.0000 | ORAL_TABLET | Freq: Two times a day (BID) | ORAL | 0 refills | Status: AC

## 2022-11-15 NOTE — Discharge Instructions (Signed)
As we discussed, you have been exposed to COVID and even though your COVID test is negative, I will give you Paxlovid.  Take Tylenol for fever  Please follow-up with your doctor.    Please self isolate for the next 5 days.  Return to ER if you have worse cough or fever or dehydration     Person Under Monitoring Name: Shannon Galloway  Location: [address] [place] [address]   Infection Prevention Recommendations for Individuals Confirmed to have, or Being Evaluated for, 2019 Novel Coronavirus (COVID-19) Infection Who Receive Care at Home  Individuals who are confirmed to have, or are being evaluated for, COVID-19 should follow the prevention steps below until a healthcare provider or local or state health department says they can return to normal activities.  Stay home except to get medical care You should restrict activities outside your home, except for getting medical care. Do not go to work, school, or public areas, and do not use public transportation or taxis.  Call ahead before visiting your doctor Before your medical appointment, call the healthcare provider and tell them that you have, or are being evaluated for, COVID-19 infection. This will help the healthcare provider's office take steps to keep other people from getting infected. Ask your healthcare provider to call the local or state health department.  Monitor your symptoms Seek prompt medical attention if your illness is worsening (e.g., difficulty breathing). Before going to your medical appointment, call the healthcare provider and tell them that you have, or are being evaluated for, COVID-19 infection. Ask your healthcare provider to call the local or state health department.  Wear a facemask You should wear a facemask that covers your nose and mouth when you are in the same room with other people and when you visit a healthcare provider. People who live with or visit you should also wear a facemask  while they are in the same room with you.  Separate yourself from other people in your home As much as possible, you should stay in a different room from other people in your home. Also, you should use a separate bathroom, if available.  Avoid sharing household items You should not share dishes, drinking glasses, cups, eating utensils, towels, bedding, or other items with other people in your home. After using these items, you should wash them thoroughly with soap and water.  Cover your coughs and sneezes Cover your mouth and nose with a tissue when you cough or sneeze, or you can cough or sneeze into your sleeve. Throw used tissues in a lined trash can, and immediately wash your hands with soap and water for at least 20 seconds or use an alcohol-based hand rub.  Wash your [employer] your hands often and thoroughly with soap and water for at least 20 seconds. You can use an alcohol-based hand sanitizer if soap and water are not available and if your hands are not visibly dirty. Avoid touching your eyes, nose, and mouth with unwashed hands.   Prevention Steps for Caregivers and Household Members of Individuals Confirmed to have, or Being Evaluated for, COVID-19 Infection Being Cared for in the Home  If you live with, or provide care at home for, a person confirmed to have, or being evaluated for, COVID-19 infection please follow these guidelines to prevent infection:  Follow healthcare provider's instructions Make sure that you understand and can help the patient follow any healthcare provider instructions for all care.  Provide for the patient's basic needs  You should help the patient with basic needs in the home and provide support for getting groceries, prescriptions, and other personal needs.  Monitor the patient's symptoms If they are getting sicker, call his or her medical provider and tell them that the patient has, or is being evaluated for, COVID-19 infection. This will  help the healthcare provider's office take steps to keep other people from getting infected. Ask the healthcare provider to call the local or state health department.  Limit the number of people who have contact with the patient If possible, have only one caregiver for the patient. Other household members should stay in another home or place of residence. If this is not possible, they should stay in another room, or be separated from the patient as much as possible. Use a separate bathroom, if available. Restrict visitors who do not have an essential need to be in the home.  Keep older adults, very young children, and other sick people away from the patient Keep older adults, very young children, and those who have compromised immune systems or chronic health conditions away from the patient. This includes people with chronic heart, lung, or kidney conditions, diabetes, and cancer.  Ensure good ventilation Make sure that shared spaces in the home have good air flow, such as from an air conditioner or an opened window, weather permitting.  Wash your hands often Wash your hands often and thoroughly with soap and water for at least 20 seconds. You can use an alcohol based hand sanitizer if soap and water are not available and if your hands are not visibly dirty. Avoid touching your eyes, nose, and mouth with unwashed hands. Use disposable paper towels to dry your hands. If not available, use dedicated cloth towels and replace them when they become wet.  Wear a facemask and gloves Wear a disposable facemask at all times in the room and gloves when you touch or have contact with the patient's blood, body fluids, and/or secretions or excretions, such as sweat, saliva, sputum, nasal mucus, vomit, urine, or feces.  Ensure the mask fits over your nose and mouth tightly, and do not touch it during use. Throw out disposable facemasks and gloves after using them. Do not reuse. Wash your hands immediately  after removing your facemask and gloves. If your personal clothing becomes contaminated, carefully remove clothing and launder. Wash your hands after handling contaminated clothing. Place all used disposable facemasks, gloves, and other waste in a lined container before disposing them with other household waste. Remove gloves and wash your hands immediately after handling these items.  Do not share dishes, glasses, or other household items with the patient Avoid sharing household items. You should not share dishes, drinking glasses, cups, eating utensils, towels, bedding, or other items with a patient who is confirmed to have, or being evaluated for, COVID-19 infection. After the person uses these items, you should wash them thoroughly with soap and water.  Wash laundry thoroughly Immediately remove and wash clothes or bedding that have blood, body fluids, and/or secretions or excretions, such as sweat, saliva, sputum, nasal mucus, vomit, urine, or feces, on them. Wear gloves when handling laundry from the patient. Read and follow directions on labels of laundry or clothing items and detergent. In general, wash and dry with the warmest temperatures recommended on the label.  Clean all areas the individual has used often Clean all touchable surfaces, such as counters, tabletops, doorknobs, bathroom fixtures, toilets, phones, keyboards, tablets, and bedside tables, every day. Also,  clean any surfaces that may have blood, body fluids, and/or secretions or excretions on them. Wear gloves when cleaning surfaces the patient has come in contact with. Use a diluted bleach solution (e.g., dilute bleach with 1 part bleach and 10 parts water) or a household disinfectant with a label that says EPA-registered for coronaviruses. To make a bleach solution at home, add 1 tablespoon of bleach to 1 quart (4 cups) of water. For a larger supply, add  cup of bleach to 1 gallon (16 cups) of water. Read labels of  cleaning products and follow recommendations provided on product labels. Labels contain instructions for safe and effective use of the cleaning product including precautions you should take when applying the product, such as wearing gloves or eye protection and making sure you have good ventilation during use of the product. Remove gloves and wash hands immediately after cleaning.  Monitor yourself for signs and symptoms of illness Caregivers and household members are considered close contacts, should monitor their health, and will be asked to limit movement outside of the home to the extent possible. Follow the monitoring steps for close contacts listed on the symptom monitoring form.   ? If you have additional questions, contact your local health department or call the epidemiologist on call at [phone] (available 24/7). ? This guidance is subject to change. For the most up-to-date guidance from [facility], please refer to their website: [email or web address]

## 2022-11-15 NOTE — ED Provider Notes (Signed)
[facility] EMERGENCY DEPARTMENT Provider Note   CSN: [id number] Arrival date & time: 11/15/22  1821     History  Chief Complaint  Patient presents with   Leg Swelling    Shannon Galloway is a 26 y.o. female history of rheumatoid arthritis on Plaquenil, POTS, here presenting with possible COVID exposure.  Patient states that she went to [place] for a [employer].  She states that 2 of her roommates tested positive for COVID.  She states that she has been coughing congested today.  She states that her legs has swelled up and then has improved.  She states that she has chronic leg swelling from rheumatoid arthritis.  She states that she is concerned for her kidney function.  She has some lower back pain as well.  She has a history of COVID infection previously that was treated with Paxlovid  The history is provided by the patient.       Home Medications Prior to Admission medications   Medication Sig Start Date End Date Taking? Authorizing Provider  dicyclomine (BENTYL) 20 MG tablet Take 1 tablet (20 mg total) by mouth 2 (two) times daily. 05/29/22   Shannon Captain, PA-C  HYDROcodone-acetaminophen (NORCO/VICODIN) 5-325 MG tablet Take 1 tablet by mouth every 6 (six) hours as needed. Patient not taking: Reported on 05/29/2022 05/11/22   Shannon Amor, PA-C  ibuprofen (ADVIL) 600 MG tablet Take 1 tablet (600 mg total) by mouth 3 (three) times daily. 05/11/22   Galloway, Shannon Fanning, PA-C  naproxen (NAPROSYN) 375 MG tablet Take 1 tablet (375 mg total) by mouth 2 (two) times daily with a meal. 05/29/22   Galloway, Abigail, PA-C  ondansetron (ZOFRAN-ODT) 4 MG disintegrating tablet Take 1 tablet (4 mg total) by mouth every 8 (eight) hours as needed for nausea or vomiting. 05/29/22   Shannon Captain, PA-C      Allergies    Patient has no known allergies.    Review of Systems   Review of Systems  Constitutional:  Positive for chills and fever.  Respiratory:  Positive for cough.   All  other systems reviewed and are negative.   Physical Exam Updated Vital Signs BP 133/85 (BP Location: Right Arm)   Pulse (!) 107   Temp 98.9 F (37.2 C) (Oral)   Resp 17   Ht 5\' 2"  (1.575 m)   Wt 104.3 kg   LMP 09/30/2022 (Approximate)   SpO2 100%   BMI 42.07 kg/m  Physical Exam Vitals and nursing note reviewed.  Constitutional:      Comments: Coughing, uncomfortable  HENT:     Head: Normocephalic.     Nose: Nose normal.     Mouth/Throat:     Mouth: Mucous membranes are moist.  Eyes:     Extraocular Movements: Extraocular movements intact.     Pupils: Pupils are equal, round, and reactive to light.  Cardiovascular:     Rate and Rhythm: Normal rate and regular rhythm.     Pulses: Normal pulses.     Heart sounds: Normal heart sounds.  Pulmonary:     Effort: Pulmonary effort is normal.     Comments: Slightly tachypneic and diminished bilateral bases Abdominal:     General: Abdomen is flat.     Palpations: Abdomen is soft.  Musculoskeletal:        General: Normal range of motion.     Cervical back: Normal range of motion and neck supple.  Skin:    General: Skin is warm.  Capillary Refill: Capillary refill takes less than 2 seconds.  Neurological:     General: No focal deficit present.     Mental Status: She is alert and oriented to person, place, and time.  Psychiatric:        Mood and Affect: Mood normal.        Behavior: Behavior normal.     ED Results / Procedures / Treatments   Labs (all labs ordered are listed, but only abnormal results are displayed) Labs Reviewed  CBC - Abnormal; Notable for the following components:      Result Value   WBC 12.0 (*)    Hemoglobin 11.5 (*)    HCT 35.9 (*)    All other components within normal limits  COMPREHENSIVE METABOLIC PANEL - Abnormal; Notable for the following components:   CO2 20 (*)    Glucose, Bld 118 (*)    Calcium 8.5 (*)    All other components within normal limits  RESP PANEL BY RT-PCR (FLU A&B,  COVID) ARPGX2  BRAIN NATRIURETIC PEPTIDE  URINALYSIS, ROUTINE W REFLEX MICROSCOPIC  PREGNANCY, URINE    EKG None  Radiology DG Chest Port 1 View  Result Date: 11/15/2022 CLINICAL DATA:  Shortness of breath, cough EXAM: PORTABLE CHEST 1 VIEW COMPARISON:  10/21/2021 FINDINGS: The heart size and mediastinal contours are within normal limits. Both lungs are clear. The visualized skeletal structures are unremarkable. No pneumothorax. IMPRESSION: No active disease. Electronically Signed   By: Charlett Nose M.D.   On: 11/15/2022 20:19    Procedures Procedures    Medications Ordered in ED Medications - No data to display  ED Course/ Medical Decision Making/ A&P                           Medical Decision Making Shannon Galloway is a 26 y.o. female here presenting with cough and fever.  Patient went to a church conference and 2 of her roommates are positive for COVID.  Patient's COVID test is negative and CBC and BMP and chest x-ray unremarkable.  However I have high suspicion for COVID since patient's roommates are positive.  Given that she has rheumatoid arthritis on Plaquenil, she would qualify for Paxlovid.   Problems Addressed: Close exposure to COVID-19 virus: acute illness or injury  Amount and/or Complexity of Data Reviewed Labs: ordered. Decision-making details documented in ED Course. Radiology: ordered and independent interpretation performed. Decision-making details documented in ED Course.    Final Clinical Impression(s) / ED Diagnoses Final diagnoses:  None    Rx / DC Orders ED Discharge Orders     None         Shannon Pander, MD 11/15/22 2051

## 2022-11-15 NOTE — ED Triage Notes (Signed)
Pt reports BLLE swelling x months. Reports hx of lupus and states her rheumatologist told her the lupus was causing the swelling.  Also c/o cough, diarrhea, intermittent fevers since Monday. Went to a [employer] over the weekend. Concerned for viral exposure.

## 2023-09-23 ENCOUNTER — Emergency Department (HOSPITAL_COMMUNITY)
Admission: EM | Admit: 2023-09-23 | Discharge: 2023-09-24 | Disposition: A | Discharge status: Home / Self Care | Payer: Medicaid Other | Attending: Emergency Medicine | Admitting: Emergency Medicine

## 2023-09-23 ENCOUNTER — Other Ambulatory Visit: Payer: Self-pay

## 2023-09-23 ENCOUNTER — Encounter (HOSPITAL_COMMUNITY): Payer: Self-pay

## 2023-09-23 DIAGNOSIS — G8929 Other chronic pain: Secondary | ICD-10-CM

## 2023-09-23 DIAGNOSIS — R519 Headache, unspecified: Secondary | ICD-10-CM | POA: Insufficient documentation

## 2023-09-23 DIAGNOSIS — M5442 Lumbago with sciatica, left side: Secondary | ICD-10-CM | POA: Diagnosis not present

## 2023-09-23 DIAGNOSIS — M545 Low back pain, unspecified: Secondary | ICD-10-CM | POA: Diagnosis present

## 2023-09-23 NOTE — ED Triage Notes (Signed)
Pt came in via POV d/t a CSF leak since she had a spinal tap done on Sep 12th when she was admitted by her rheumatologist at [facility] for her sever HA's lately. Her HA's are now worse & back pain is unbearable. She was seen at a smaller community hospital that she needs to cone to a larger facility that is better able to give her a blood patch. A/Ox4, rates her pain 10/10 while in triage.

## 2023-09-24 MED ORDER — KETOROLAC TROMETHAMINE 15 MG/ML IJ SOLN
15.0000 mg | Freq: Once | INTRAMUSCULAR | Status: AC
  Administered 2023-09-24: 15 mg via INTRAMUSCULAR
  Filled 2023-09-24: qty 1

## 2023-09-24 MED ORDER — DEXAMETHASONE SODIUM PHOSPHATE 10 MG/ML IJ SOLN
10.0000 mg | Freq: Once | INTRAMUSCULAR | Status: AC
  Administered 2023-09-24: 10 mg via INTRAMUSCULAR
  Filled 2023-09-24: qty 1

## 2023-09-24 NOTE — Discharge Instructions (Signed)
You were evaluated today for a headache and back pain.  Review of notes from your recent emergency department visit shows that they did not recommend a blood patch and do not believe your headache is related to a spinal fluid leak.  Please keep your upcoming neurology appointment for further evaluation.  Your back pain symptoms are consistent with radiculopathy or sciatica.  Please follow-up with a primary care physician for further evaluation as needed.  I have attached information on sciatica.  If you develop any life-threatening symptoms please return to the emergency department.

## 2023-09-24 NOTE — ED Provider Notes (Signed)
[place] EMERGENCY DEPARTMENT AT [facility] Provider Note   CSN: [id number] Arrival date & time: 09/23/23  1439     History  Chief Complaint  Patient presents with   Headache   Back Pain    Shannon Galloway is a 27 y.o. female.  Patient presents to the emergency department complaining of headaches and back pain.  She states that she was evaluated at an outside emergency department a few days ago and was told that she may need to come to a larger hospital for a possible blood patch due to a possible CSF leak.  The patient states that on September 12 that she had a lumbar puncture.  Chart review shows that the patient had a lumbar puncture due to frequent headaches, possibly to rule out IIH.  The patient states that the lumbar puncture provided no relief in pain and that she is continue to have headache since that time.  She does state that her headache seems to get somewhat worse when she sits upright.  Otherwise the headache is consistent with her frequent headaches that she has been having for the past month or more.  She does have an upcoming appointment with neurology in October for evaluation of her headaches.  The patient also complains of back pain which began today.  The back pain is in bilateral lumbar region and she has burning pain radiating down the left leg to the level of the knee.  She denies any trauma or injury, denies saddle anesthesia, IV drug use, urinary incontinence, urinary retention, fecal incontinence.  Past medical history significant for bipolar 2 disorder, substance abuse disorder, POTS, chronic headaches, lupus  HPI     Home Medications Prior to Admission medications   Medication Sig Start Date End Date Taking? Authorizing Provider  dicyclomine (BENTYL) 20 MG tablet Take 1 tablet (20 mg total) by mouth 2 (two) times daily. 05/29/22   Arthor Captain, PA-C  HYDROcodone-acetaminophen (NORCO/VICODIN) 5-325 MG tablet Take 1 tablet by mouth every 6 (six)  hours as needed. Patient not taking: Reported on 05/29/2022 05/11/22   Burgess Amor, PA-C  ibuprofen (ADVIL) 600 MG tablet Take 1 tablet (600 mg total) by mouth 3 (three) times daily. 05/11/22   Idol, Raynelle Fanning, PA-C  naproxen (NAPROSYN) 375 MG tablet Take 1 tablet (375 mg total) by mouth 2 (two) times daily with a meal. 05/29/22   Harris, Abigail, PA-C  ondansetron (ZOFRAN-ODT) 4 MG disintegrating tablet Take 1 tablet (4 mg total) by mouth every 8 (eight) hours as needed for nausea or vomiting. 05/29/22   Arthor Captain, PA-C      Allergies    Patient has no known allergies.    Review of Systems   Review of Systems  Physical Exam Updated Vital Signs BP 128/85 (BP Location: Right Arm)   Pulse 91   Temp 98.5 F (36.9 C)   Resp 18   Ht 5\' 2"  (1.575 m)   Wt 99.8 kg   SpO2 100%   BMI 40.24 kg/m  Physical Exam Vitals and nursing note reviewed.  HENT:     Head: Normocephalic and atraumatic.  Eyes:     Pupils: Pupils are equal, round, and reactive to light.  Pulmonary:     Effort: Pulmonary effort is normal. No respiratory distress.  Musculoskeletal:        General: Tenderness (Mild tenderness to low back bilaterally with no midline spinal tenderness appreciated) present. No signs of injury. Normal range of motion.  Cervical back: Normal range of motion.  Skin:    General: Skin is dry.  Neurological:     General: No focal deficit present.     Mental Status: She is alert and oriented to person, place, and time.  Psychiatric:        Speech: Speech normal.        Behavior: Behavior normal.     ED Results / Procedures / Treatments   Labs (all labs ordered are listed, but only abnormal results are displayed) Labs Reviewed - No data to display  EKG None  Radiology No results found.  Procedures Procedures    Medications Ordered in ED Medications  ketorolac (TORADOL) 15 MG/ML injection 15 mg (15 mg Intramuscular Given 09/24/23 0232)  dexamethasone (DECADRON) injection 10 mg  (10 mg Intramuscular Given 09/24/23 0233)    ED Course/ Medical Decision Making/ A&P                                 Medical Decision Making Risk Prescription drug management.   This patient presents to the ED for concern of headache and back pain, this involves an extensive number of treatment options, and is a complaint that carries with it a high risk of complications and morbidity.  The differential diagnosis includes migraine, cluster headache, tension headache, other headache disorder.  Differential for back pain includes muscle strain, sciatica, fracture, others   Co morbidities that complicate the patient evaluation  POTS, chronic headaches   Additional history obtained:  Additional history obtained from patient's family at bedside External records from outside source obtained and reviewed including outside emergency department notes from 3 days ago showing anesthesia not recommending blood patch, believing this was not a spinal headache.    Imaging Studies ordered:  This is not the worst headache the patient has ever had, no red flags, no indication at this time for repeat brain imaging.  I did review imaging from 9 12 through 9 14 including MRI of the brain, CT head with no significant acute findings She has no red flag back pain symptoms that would require imaging at this time.   Problem List / ED Course / Critical interventions / Medication management   I ordered medication including Toradol and Decadron for headache and radicular nerve pain Reevaluation of the patient after these medicines showed that the patient improved I have reviewed the patients home medicines and have made adjustments as needed   Social Determinants of Health:  Patient has Medicaid for primary health insurance type   Test / Admission - Considered:  Based on presentation, timing of symptoms, review of notes, doubt spinal headache at this time.  Patient states that her chief concern at  this time is her back pain radiating into her left leg.  Her symptoms are consistent with a radiculopathy.  She has no red flag symptoms that would require spinal imaging at this time.  She does feel somewhat better after steroids and anti-inflammatories.  Plan to discharge at this time with plans for follow-up as scheduled with neurology.  Return precautions provided.  No indication at this time for admission.  Discharge home         Final Clinical Impression(s) / ED Diagnoses Final diagnoses:  Acute bilateral low back pain with left-sided sciatica  Chronic nonintractable headache, unspecified headache type    Rx / DC Orders ED Discharge Orders     None  Pamala Duffel 09/24/23 0253    Ernie Avena, MD 09/24/23 2159

## 2023-10-01 ENCOUNTER — Ambulatory Visit: Payer: Medicaid Other | Admitting: Family Medicine

## 2023-10-01 HISTORY — PX: EPIDURAL BLOOD PATCH: SHX1517

## 2023-10-07 ENCOUNTER — Encounter: Payer: Self-pay | Admitting: General Practice

## 2023-10-23 ENCOUNTER — Encounter: Payer: Self-pay | Admitting: Neurology

## 2023-10-23 ENCOUNTER — Ambulatory Visit: Payer: Medicaid Other | Admitting: Neurology

## 2023-10-23 VITALS — BP 114/71 | HR 86 | Ht 62.0 in | Wt 237.6 lb

## 2023-10-23 DIAGNOSIS — O9279 Other disorders of lactation: Secondary | ICD-10-CM

## 2023-10-23 DIAGNOSIS — H547 Unspecified visual loss: Secondary | ICD-10-CM | POA: Diagnosis not present

## 2023-10-23 DIAGNOSIS — H532 Diplopia: Secondary | ICD-10-CM | POA: Diagnosis not present

## 2023-10-23 DIAGNOSIS — H4710 Unspecified papilledema: Secondary | ICD-10-CM

## 2023-10-23 DIAGNOSIS — H543 Unqualified visual loss, both eyes: Secondary | ICD-10-CM

## 2023-10-23 DIAGNOSIS — H93A9 Pulsatile tinnitus, unspecified ear: Secondary | ICD-10-CM

## 2023-10-23 DIAGNOSIS — N6452 Nipple discharge: Secondary | ICD-10-CM

## 2023-10-23 DIAGNOSIS — Z87898 Personal history of other specified conditions: Secondary | ICD-10-CM

## 2023-10-23 DIAGNOSIS — R519 Headache, unspecified: Secondary | ICD-10-CM

## 2023-10-23 DIAGNOSIS — G971 Other reaction to spinal and lumbar puncture: Secondary | ICD-10-CM

## 2023-10-23 MED ORDER — TOPIRAMATE 100 MG PO TABS: 100.0000 mg | ORAL_TABLET | Freq: Every day | ORAL | 3 refills | Status: AC

## 2023-10-23 NOTE — Patient Instructions (Addendum)
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 Will order deficiencies in vitamins B1, B2, B9, and B12 can cause optic nerve head swelling and other visual disturbances: Blood patch for spinal headache Dr. Dione Booze ophthalmologist ** needs description for short-term diability put in Assessment and Plan ** Needs primary care ask stacy burns to make exception  Other causes of optic nerve head swelling, also known as papilledema, include: High intracranial pressure Tumors Infections, bleeding, or inflammation in the brain or meninges Cerebral venous sinus thrombosis Iron-deficiency anemia Medication usage? Plaquenil? Idiopathic intracranial hypertension (IIH)   Idiopathic Intracranial Hypertension  Idiopathic intracranial hypertension (IIH) is a condition that increases pressure around the brain. The fluid that surrounds the brain and spinal cord (cerebrospinal fluid, or CSF) increases and causes the pressure. Idiopathic means that the cause of this condition is not known. IIH affects the brain and spinal cord. If this condition is not treated, it can cause vision loss or blindness. What are the causes? The cause of this condition is not known. What increases the risk? The following factors may make you more likely to develop this condition: Being obese. Being a person who is female, between the ages of 32 and 16 years old, and who has not gone through menopause. Taking certain medicines, such as birth control, acne medicines, or steroids. What are the signs or symptoms? Symptoms of this condition include: Headaches. This is the most common symptom. Brief periods of total blindness. Double vision, blurred vision, or poor side (peripheral) vision. Pain in the shoulders or  neck. Nausea and vomiting. A sound like rushing water or a pulsing sound within the ears (pulsatile tinnitus), or ringing in the ears. How is this diagnosed? This condition may be diagnosed based on: Your symptoms and medical history. Imaging tests of the brain, such as: CT scan. MRI. Magnetic resonance venogram (MRV) to check the veins. Diagnostic lumbar puncture. This is a procedure to remove and examine a sample of CSF. This procedure can determine whether your fluid pressure is too high. An eye exam to check for swelling or nerve damage in the eyes. How is this treated? Treatment for this condition depends on the symptoms. The goal of treatment is to decrease the pressure around your brain. Common treatments include: Weight loss through healthy eating, salt restriction, and exercise, if you are overweight. Medicines to decrease the production of CSF and lower the pressure within your skull. Medicines to prevent or treat headaches. Other treatments may include: Surgery to place drains (shunts) in your brain to remove extra fluid. Lumbar puncture to remove extra CSF. Follow these instructions at home: If you are overweight or obese, work with your health care provider to lose weight. Take over-the-counter and prescription medicines only as told by your health care provider. Ask your health care provider if the medicine prescribed to you requires you to avoid driving or using machinery. Do not use any products that contain nicotine or tobacco. These products include cigarettes, chewing tobacco, and vaping devices, such as e-cigarettes. If you need help quitting, ask your health care provider. Keep all follow-up visits. Your health care provider will need to monitor you regularly. Contact a health care provider if: You have changes in your vision, such as: Double vision. Blurred vision. Poor peripheral vision. Get help right away if: You have  any of the following symptoms and they get  worse or do not get better: Headaches. Nausea. Vomiting. Sudden trouble seeing. This information is not intended to replace advice given to you by your health care provider. Make sure you discuss any questions you have with your health care provider. Document Revised: 05/15/2022 Document Reviewed: 04/24/2022 Elsevier Patient Education  2024 [employer].

## 2023-10-23 NOTE — Progress Notes (Signed)
[facility]    Provider:  Dr Lucia Gaskins Requesting Provider: Michaelle Copas, MD Primary Care Provider:  Patient, No Pcp Per  CC:  IIH  HPI:  Shannon Galloway is a 27 y.o. female here as requested by Michaelle Copas, MD for optic nerve head edema. has General counseling and advice on female contraception; Nexplanon insertion; Depression; Syncope; Bipolar 2 disorder, major depressive episode (HCC); Suicide attempt by hanging [facility]); Cocaine use disorder, moderate, dependence (HCC); Amphetamine use disorder, moderate (HCC); Cannabis use disorder, moderate, dependence (HCC); Sedative, hypnotic or anxiolytic use disorder, severe, dependence (HCC); and Tobacco use disorder on their problem list. She has RA and Lupus.   Pressue headaches. Hospitalized 9/11 started in August.never had headaches. No personal or family hx of migraines. Went to the D for headaches. Had a thorough evaluation including MRI brain, CT Venogram, Lumbar puncture (cannot find opening pressure), EMG/NCS. She has visionchanges, enlarged blind spots bilaterally, ophthalmology showed mild optic nerve head edema. Gained a lot of weight. Pressure in a band around the head. Just a lot of pressure, neck pain, vision changes, since the lumbar puncture she has spinal tap headache and continues to have headaches on standing likely spinal tap headache will see if we can send to GI for a blood patch. Need opening pressure. Hydroxyxloroquine causing edema? Has ringing in the ears and pulsatile tinnitus, diplopia, vision loss, headache, neck pain, pulasting in the ears. She has gained a lot of weight.No other focal neurologic deficits, associated symptoms, inciting events or modifiable factors.  Reviewed notes, labs and imaging from outside physicians, which showed:  09/13/2023: MRI BRAIN WITHOUT AND WITH CONTRAST   INDICATION: Headache, chronic, new features or increased frequency, M32.8  Other forms of systemic lupus  erythematosus (CMS/HHS-HCC)   COMPARISON: Noncontrast CT head dated 09/11/2023   TECHNIQUE/PROTOCOL: Standard adult brain protocol without and with IV  contrast.   FINDINGS:  Brain Parenchyma:  No hemorrhage, cerebral edema, acute cortical  infarction, mass, mass effect, or midline shift.  No abnormal enhancement.  Ventricles and Sulci: Normal for age.   Extra-Axial Spaces: No extra-axial fluid collection.  Basal Cisterns: Normal.  Intracranial Flow-Voids: Normal.   Paranasal Sinuses: Normal.  Mastoid air cells: well aerated  Orbits: Normal.  Cranium: Normal.  Visualized upper cervical spine: No high grade stenosis.   IMPRESSION:  Normal brain MRI   TECHNIQUE: Axial images obtained from base of skull to the vertex with  intravenous contrast in the venous phase. 2-D and 3-D reformations are  created.   COMPARISON: No prior study available for comparison.   FINDINGS: The dural venous structures are normal. The superior sagittal  sinus, transverse sinuses, and sigmoid sinuses are normal. Cortical venous  structures are unremarkable. Cavernous sinuses are normal bilaterally.   IMPRESSION: No pathologic findings. No evidence of venous thrombosis or  occlusion. No obvious stenosis identified.   Review of Systems: Patient complains of symptoms per HPI as well as the following symptoms headache. Pertinent negatives and positives per HPI. All others negative.   Social History   Socioeconomic History   Marital status: Single    Spouse name: Not on file   Number of children: Not on file   Years of education: Not on file   Highest education level: Not on file  Occupational History   Not on file  Tobacco Use   Smoking status: Former    Current packs/day: 0.01    Types: Cigarettes   Smokeless tobacco: Never  Vaping  Use   Vaping status: Every Day   Substances: Nicotine  Substance and Sexual Activity   Alcohol use: Not Currently    Comment: states 5 weeks sober   Drug  use: Not Currently    Types: Marijuana    Comment: states 5 weeks sober   Sexual activity: Not Currently    Birth control/protection: None  Other Topics Concern   Not on file  Social History Narrative   Not on file   Social Determinants of Health   Financial Resource Strain: Low Risk  (09/18/2023)   Received from [facility]   Overall Financial Resource Strain (CARDIA)    Difficulty of Paying Living Expenses: Not hard at all  Food Insecurity: No Food Insecurity (09/18/2023)   Received [facility]   Hunger Vital Sign    Worried About Running Out of Food in the Last Year: Never true    Ran Out of Food in the Last Year: Never true  Transportation Needs: No Transportation Needs (09/18/2023)   Received from [facility] - Transportation    In the past 12 months, has lack of transportation kept you from medical appointments or from getting medications?: No    Lack of Transportation (Non-Medical): No  Physical Activity: Insufficiently Active (10/12/2019)   Received from [facility] visits prior to 03/02/2023., [facility] [address] visits prior to 03/02/2023.   Exercise Vital Sign    Days of Exercise per Week: 3 days    Minutes of Exercise per Session: 30 min  Stress: No Stress Concern Present (10/12/2019)   Received from [facility] visits prior to 03/02/2023., [facility] visits prior to 03/02/2023.   [employer] of Occupational Health - Occupational Stress Questionnaire    Feeling of Stress : Only a little  Social Connections: Unknown (05/14/2022)   Received from [facility]   Social Network    Social Network: Not on file  Intimate Partner Violence: Unknown (04/05/2022)   Received from [facility]   HITS    Physically Hurt: Not on file    Insult or Talk Down To: Not on file    Threaten Physical Harm: Not on  file    Scream or Curse: Not on file    Family History  Adopted: Yes  Problem Relation Age of Onset   Heart failure Father        Died at 33   Bipolar disorder Mother    Pneumonia Mother        Died at 105    Past Medical History:  Diagnosis Date   Chronic headaches    Lupus    Neck pain    POTS (postural orthostatic tachycardia syndrome)    Syncope    a. unclear etilogy. 10/2016: cardiac MRI and echo unremarkable.     Patient Active Problem List   Diagnosis Date Noted   Bipolar 2 disorder, major depressive episode (HCC) 06/05/2017   Suicide attempt by hanging (HCC) 06/05/2017   Cocaine use disorder, moderate, dependence (HCC) 06/05/2017   Amphetamine use disorder, moderate (HCC) 06/05/2017   Cannabis use disorder, moderate, dependence (HCC) 06/05/2017   Sedative, hypnotic or anxiolytic use disorder, severe, dependence (HCC) 06/05/2017   Tobacco use disorder 06/05/2017   Syncope 11/22/2016   Depression 12/22/2015   Nexplanon insertion 06/10/2013   General counseling and advice on female contraception 06/01/2013    Past  Surgical History:  Procedure Laterality Date   KNEE SURGERY Right 2011   TONSILLECTOMY Bilateral     Current Outpatient Medications  Medication Sig Dispense Refill   Cyanocobalamin (B-12 PO) Take by mouth.     topiramate (TOPAMAX) 100 MG tablet Take 1 tablet (100 mg total) by mouth at bedtime. 90 tablet 3   No current facility-administered medications for this visit.    Allergies as of 10/23/2023   (No Known Allergies)    Vitals: BP 114/71   Pulse 86   Ht 5\' 2"  (1.575 m)   Wt 237 lb 9.6 oz (107.8 kg)   BMI 43.46 kg/m  Last Weight:  Wt Readings from Last 1 Encounters:  10/23/23 237 lb 9.6 oz (107.8 kg)   Last Height:   Ht Readings from Last 1 Encounters:  10/23/23 5\' 2"  (1.575 m)     Physical exam: Exam: Gen: NAD, conversant, well nourised, obese, well groomed                     CV: RRR, no MRG. No Carotid Bruits. No  peripheral edema, warm, nontender Eyes: Conjunctivae clear without exudates or hemorrhage  Neuro: Detailed Neurologic Exam  Speech:    Speech is normal; fluent and spontaneous with normal comprehension.  Cognition:    The patient is oriented to person, place, and time;     recent and remote memory intact;     language fluent;     normal attention, concentration,     fund of knowledge Cranial Nerves:    The pupils are equal, round, and reactive to light. Elevated ONHs? Visual fields are full to finger confrontation. Extraocular movements are intact. Trigeminal sensation is intact and the muscles of mastication are normal. The face is symmetric. The palate elevates in the midline. Hearing intact. Voice is normal. Shoulder shrug is normal. The tongue has normal motion without fasciculations.   Coordination: nml  Gait: nml  Motor Observation:    No asymmetry, no atrophy, and no involuntary movements noted. Tone:    Normal muscle tone.    Posture:    Posture is normal. normal erect    Strength:    Strength is V/V in the upper and lower limbs.      Sensation: intact to LT     Reflex Exam:  DTR's:    Deep tendon reflexes in the upper and lower extremities are normal bilaterally.   Toes:    The toes are downgoing bilaterally.   Clonus:    Clonus is absent.    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 Also recent optometrist exam with "ONH edema and enlarged blind spots". She was evaluated extensively at [facility] with opening pressure of only 17. Needs further evaluation:  Send to Dr. Dione Booze for ophthalmologic evaluation. Most recently Patient Saw optometrist who stated mild ONH edema and VF testing showed enlarged blind spots but [facility] prior stated normal. Would like someone I trust to evaluate her for elevated optic nerves/papilledema/OCT and VF testing. Opening pressure was only 17 but she has all the characteristics of  IDIOPATHIC INTRACRANIAL HYPERTENSION and although her disk margins are crisp I may see some ONH elevation on exam. Thank you for taking a look! Was on plaquenil, now stopped - could this be the cause? 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 for her pituitary tumor and nipple discharge from breasts will refer Has not had a mammogram for years for her nipple discharge (not pregnant) and does not have a pcp will order and see if I can ask a colleague to see her deficiencies in vitamins B1, B2, B9, and B12 can cause optic nerve head swelling and other visual disturbances, check bloodwork Blood patch for spinal headache: since LP has headache worse with standing, better with laying Patient should be on short-term disability until work up complete due to very concerning symptoms as above Was on plaquenil, now stopped - could this be the cause? See ophthalmology  Discussed:  Other causes of optic nerve head swelling, also known as papilledema, include: High intracranial pressure  Tumors Infections, bleeding, or inflammation in the brain or meninges Cerebral venous sinus thrombosis (CTV negative) Iron-deficiency anemia Medication usage? Plaquenil?  Meds ordered this encounter  Medications   topiramate (TOPAMAX) 100 MG tablet    Sig: Take 1 tablet (100 mg total) by mouth at bedtime.    Dispense:  90 tablet    Refill:  3   Orders Placed This Encounter  Procedures   MR ORBITS W WO CONTRAST   MR ANGIO HEAD WO CONTRAST   MR BRAIN W WO CONTRAST   MM 3D SCREENING MAMMOGRAM BILATERAL BREAST   Prolactin   Vitamin B1   Vitamin B2(Riboflavin),Plasma   B12 and Folate Panel   Methylmalonic acid, serum   CBC with  Differential/Platelets   Comprehensive metabolic panel   TSH Rfx on Abnormal to Free T4   Vitamin B2, Whole Blood   Ambulatory referral to Ophthalmology   Ambulatory referral to Endocrinology   Discussed:   Idiopathic intracranial hypertension (IIH)   Idiopathic Intracranial Hypertension  Idiopathic intracranial hypertension (IIH) is a condition that increases pressure around the brain. The fluid that surrounds the brain and spinal cord (cerebrospinal fluid, or CSF) increases and causes the pressure. Idiopathic means that the cause of this condition is not known. IIH affects the brain and spinal cord. If this condition is not treated, it can cause vision loss or blindness. What are the causes? The cause of this condition is not known. What increases the risk? The following factors may make you more likely to develop this condition: Being obese. Being a person who is female, between the ages of 15 and 68 years old, and who has not gone through menopause. Taking certain medicines, such as birth control, acne medicines, or steroids. What are the signs or symptoms? Symptoms of this condition include: Headaches. This is the most common symptom. Brief periods of total blindness. Double vision, blurred vision, or poor side (peripheral) vision. Pain in the shoulders or neck. Nausea and vomiting. A sound like rushing water or a pulsing sound within the ears (pulsatile tinnitus), or ringing in the ears. How is this diagnosed? This condition may be diagnosed based on: Your symptoms and medical history. Imaging tests of the brain, such as: CT scan. MRI. Magnetic resonance venogram (MRV) to check the veins. Diagnostic lumbar puncture. This is a procedure to remove and examine a sample of CSF. This procedure can determine whether your fluid pressure is too high. An eye exam to check for swelling or nerve damage in the eyes. How is this treated? Treatment for this condition depends on the  symptoms. The goal of treatment is to decrease the pressure around your brain.  Common treatments include: Weight loss through healthy eating, salt restriction, and exercise, if you are overweight. Medicines to decrease the production of CSF and lower the pressure within your skull. Medicines to prevent or treat headaches. Other treatments may include: Surgery to place drains (shunts) in your brain to remove extra fluid. Lumbar puncture to remove extra CSF. Follow these instructions at home: If you are overweight or obese, work with your health care provider to lose weight. Take over-the-counter and prescription medicines only as told by your health care provider. Ask your health care provider if the medicine prescribed to you requires you to avoid driving or using machinery. Do not use any products that contain nicotine or tobacco. These products include cigarettes, chewing tobacco, and vaping devices, such as e-cigarettes. If you need help quitting, ask your health care provider. Keep all follow-up visits. Your health care provider will need to monitor you regularly. Contact a health care provider if: You have changes in your vision, such as: Double vision. Blurred vision. Poor peripheral vision. Get help right away if: You have any of the following symptoms and they get worse or do not get better: Headaches. Nausea. Vomiting. Sudden trouble seeing. This information is not intended to replace advice given to you by your health care provider. Make sure you discuss any questions you have with your health care provider. Document Revised: 05/15/2022 Document Reviewed: 04/24/2022 Elsevier Patient Education  2024 Elsevier Inc.  For any acute change especially worsening headache or vision loss call 911 and proceed to ED  To prevent or relieve headaches, try the following: Cool Compress. Lie down and place a cool compress on your head.  Avoid headache triggers. If certain foods or odors  seem to have triggered your migraines in the past, avoid them. A headache diary might help you identify triggers.  Include physical activity in your daily routine. Try a daily walk or other moderate aerobic exercise.  Manage stress. Find healthy ways to cope with the stressors, such as delegating tasks on your to-do list.  Practice relaxation techniques. Try deep breathing, yoga, massage and visualization.  Eat regularly. Eating regularly scheduled meals and maintaining a healthy diet might help prevent headaches. Also, drink plenty of fluids.  Follow a regular sleep schedule. Sleep deprivation might contribute to headaches Consider biofeedback. With this mind-body technique, you learn to control certain bodily functions -- such as muscle tension, heart rate and blood pressure -- to prevent headaches or reduce headache pain.    Proceed to emergency room if you experience new or worsening symptoms or symptoms do not resolve, if you have new neurologic symptoms or if headache is severe, or for any concerning symptom.   Provided education and documentation from American headache Society toolbox including articles on: pseudotumoer cerebri(IIH), chronic migraine medication overuse headache, chronic migraines, prevention of migraines and other headaches, behavioral and other nonpharmacologic treatments for headache.    Orders Placed This Encounter  Procedures   MR ORBITS W WO CONTRAST   MR ANGIO HEAD WO CONTRAST   MR BRAIN W WO CONTRAST   MM 3D SCREENING MAMMOGRAM BILATERAL BREAST   Prolactin   Vitamin B1   Vitamin B2(Riboflavin),Plasma   B12 and Folate Panel   Methylmalonic acid, serum   CBC with Differential/Platelets   Comprehensive metabolic panel   TSH Rfx on Abnormal to Free T4   Vitamin B2, Whole Blood   Ambulatory referral to Ophthalmology   Ambulatory referral to Endocrinology   Meds ordered this encounter  Medications  topiramate (TOPAMAX) 100 MG tablet    Sig: Take 1 tablet  (100 mg total) by mouth at bedtime.    Dispense:  90 tablet    Refill:  3    Cc: Michaelle Copas, MD,  Patient, No Pcp Per  Naomie Dean, MD  [facility] [address] [place], [place] [address]  Phone [phone] Fax [phone]  I spent 75 minutes of face-to-face and non-face-to-face time with patient on the  1. Intractable headache, unspecified chronicity pattern, unspecified headache type   2. Vision loss   3. Papilledema   4. Diplopia   5. Vision loss, bilateral   6. Pulsatile tinnitus   7. Lactation symptom   8. H/O: pituitary tumor   9. Nipple discharge   10. Spinal headache    diagnosis.  This included previsit chart review, lab review, study review, order entry, electronic health record documentation, patient education on the different diagnostic and therapeutic options, counseling and coordination of care, risks and benefits of management, compliance, or risk factor reduction

## 2023-10-24 ENCOUNTER — Telehealth: Payer: Self-pay | Admitting: Neurology

## 2023-10-24 ENCOUNTER — Telehealth: Payer: Self-pay | Admitting: *Deleted

## 2023-10-24 ENCOUNTER — Telehealth: Payer: Self-pay

## 2023-10-24 DIAGNOSIS — G971 Other reaction to spinal and lumbar puncture: Secondary | ICD-10-CM

## 2023-10-24 DIAGNOSIS — R51 Headache with orthostatic component, not elsewhere classified: Secondary | ICD-10-CM

## 2023-10-24 NOTE — Telephone Encounter (Signed)
-----   Message from Anson Fret sent at 10/23/2023  8:45 PM EDT ----- Regarding: Blood Patch Will you order a blood patch for spinal headache for patient to [facility]? Please call and see if we can get it scheduled, she did not have the LP there but is having persistent positional headache since (had it at [facility], will GI do the blood patch?). Let patient know what the answer is, thanks

## 2023-10-24 NOTE — Telephone Encounter (Signed)
-----   Message from Anson Fret sent at 10/24/2023 12:50 PM EDT ----- So far blood work looks great. I placed all the orders we discussed! I'll be in [place] until November but my colleagues and nurses can answer any questions and I will let them know they can forward anything to me bc I can check in [place]. It was SO nice meeting you ! Dr Lucia Gaskins (FYI patient is very darling and scared feel free to reach out to me for anything about her even in [place])

## 2023-10-24 NOTE — Telephone Encounter (Signed)
I called [facility] and LVM for Shannon Galloway asking if they can do a blood patch for the patient. It has been ordered. I left our office number for call back.   I tried to call the pt but her VM is full. I sent her a Clinical cytogeneticist message.

## 2023-10-24 NOTE — Telephone Encounter (Signed)
Contacted pt regarding labs, no answer and VM full.

## 2023-10-24 NOTE — Telephone Encounter (Signed)
Joni Reining @ GI said they can do the blood patch and will reach out to the patient.

## 2023-10-24 NOTE — Telephone Encounter (Signed)
Referral for ophthalmology fax to [facility]. Phone: [phone], Fax: [phone]. 

## 2023-10-26 ENCOUNTER — Encounter: Payer: Self-pay | Admitting: Neurology

## 2023-10-29 ENCOUNTER — Telehealth: Payer: Self-pay | Admitting: Neurology

## 2023-10-29 LAB — B12 AND FOLATE PANEL
Folate: 9.2 ng/mL (ref >3.0)
Vitamin B-12: 1142 pg/mL (ref 232–1245)

## 2023-10-29 LAB — CBC WITH DIFFERENTIAL/PLATELET
Basophils Absolute: 0.1 10*3/uL (ref 0.0–0.2)
Basos: 1 %
EOS (ABSOLUTE): 0.2 10*3/uL (ref 0.0–0.4)
Eos: 2 %
Hematocrit: 37.8 % (ref 34.0–46.6)
Hemoglobin: 11.8 g/dL (ref 11.1–15.9)
Immature Grans (Abs): 0 10*3/uL (ref 0.0–0.1)
Immature Granulocytes: 0 %
Lymphocytes Absolute: 2.5 10*3/uL (ref 0.7–3.1)
Lymphs: 26 %
MCH: 27.3 pg (ref 26.6–33.0)
MCHC: 31.2 g/dL — ABNORMAL LOW (ref 31.5–35.7)
MCV: 88 fL (ref 79–97)
Monocytes Absolute: 0.7 10*3/uL (ref 0.1–0.9)
Monocytes: 7 %
Neutrophils Absolute: 6 10*3/uL (ref 1.4–7.0)
Neutrophils: 64 %
Platelets: 305 10*3/uL (ref 150–450)
RBC: 4.32 x10E6/uL (ref 3.77–5.28)
RDW: 13.7 % (ref 11.7–15.4)
WBC: 9.5 10*3/uL (ref 3.4–10.8)

## 2023-10-29 LAB — COMPREHENSIVE METABOLIC PANEL
ALT: 15 [IU]/L (ref 0–32)
AST: 10 [IU]/L (ref 0–40)
Albumin: 4 g/dL (ref 4.0–5.0)
Alkaline Phosphatase: 60 IU/L (ref 44–121)
BUN/Creatinine Ratio: 13 (ref 9–23)
BUN: 9 mg/dL (ref 6–20)
Bilirubin Total: <0.2 mg/dL (ref 0.0–1.2)
CO2: 20 mmol/L (ref 20–29)
Calcium: 8.9 mg/dL (ref 8.7–10.2)
Chloride: 106 mmol/L (ref 96–106)
Creatinine, Ser: 0.67 mg/dL (ref 0.57–1.00)
Globulin, Total: 3 g/dL (ref 1.5–4.5)
Glucose: 85 mg/dL (ref 70–99)
Potassium: 4.5 mmol/L (ref 3.5–5.2)
Sodium: 138 mmol/L (ref 134–144)
Total Protein: 7 g/dL (ref 6.0–8.5)
eGFR: 123 mL/min/{1.73_m2} (ref >59)

## 2023-10-29 LAB — VITAMIN B1: Thiamine: 109 nmol/L (ref 66.5–200.0)

## 2023-10-29 LAB — PROLACTIN: Prolactin: 20.6 ng/mL (ref 4.8–33.4)

## 2023-10-29 LAB — TSH RFX ON ABNORMAL TO FREE T4: TSH: 2.34 u[IU]/mL (ref 0.450–4.500)

## 2023-10-29 LAB — METHYLMALONIC ACID, SERUM: Methylmalonic Acid: 97 nmol/L (ref 0–378)

## 2023-10-29 NOTE — Telephone Encounter (Signed)
MRI brain & orbits: [facility] [place]: [id number] exp. 10/29/23-12/27/23  MRA head: [facility]: [id number] exp. 10/29/23-12/27/23 sent to GI [phone]

## 2023-10-30 LAB — VITAMIN B2, WHOLE BLOOD: Vitamin B2, Whole Blood: 247 ug/L (ref 137–370)

## 2023-10-30 NOTE — Discharge Instructions (Signed)
Blood Patch Discharge Instructions  Go home and rest quietly for the next 24 hours.  It is important to lie flat for the next 24 hours.  Get up only to go to the restroom.  You may lie in the bed or on a couch on your back, your stomach, your left side or your right side.  You may have one pillow under your head.  You may have pillows between your knees while you are on your side or under your knees while you are on your back.  DO NOT drive today.  Recline the seat as far back as it will go, while still wearing your seat belt, on the way home.  You may get up to go to the bathroom as needed.  You may sit up for 10 minutes to eat.  You may resume your normal diet and medications unless otherwise indicated.  Drink lots of extra fluids today and tomorrow..   You may resume normal activities after your 24 hours of bed rest is over; however, do not exert yourself strongly or do any heavy lifting tomorrow.  Call your physician for a follow-up appointment.   If you have any questions  after you arrive home, please call [phone].  Discharge instructions have been explained to the patient.  The patient, or the person responsible for the patient, fully understands these instructions.    

## 2023-10-31 ENCOUNTER — Encounter: Payer: Self-pay | Admitting: *Deleted

## 2023-10-31 ENCOUNTER — Ambulatory Visit
Admission: RE | Admit: 2023-10-31 | Discharge: 2023-10-31 | Disposition: A | Discharge status: Home / Self Care | Payer: Medicaid Other | Source: Ambulatory Visit | Attending: Neurology | Admitting: Neurology

## 2023-10-31 DIAGNOSIS — G971 Other reaction to spinal and lumbar puncture: Secondary | ICD-10-CM

## 2023-10-31 DIAGNOSIS — R51 Headache with orthostatic component, not elsewhere classified: Secondary | ICD-10-CM

## 2023-10-31 MED ORDER — IOPAMIDOL (ISOVUE-M 200) INJECTION 41%
1.0000 mL | Freq: Once | INTRAMUSCULAR | Status: AC
  Administered 2023-10-31: 1 mL via EPIDURAL

## 2023-10-31 NOTE — Progress Notes (Signed)
20 cc of blood drawn from pts Right hand to be sent off with LP lab work. 1 successful attempt. Pt tolerated well. Gauze and tape applied after.

## 2023-11-19 ENCOUNTER — Ambulatory Visit
Admission: RE | Admit: 2023-11-19 | Discharge: 2023-11-19 | Disposition: A | Discharge status: Home / Self Care | Payer: Medicaid Other | Source: Ambulatory Visit | Attending: Neurology

## 2023-11-19 ENCOUNTER — Ambulatory Visit
Admission: RE | Admit: 2023-11-19 | Discharge: 2023-11-19 | Disposition: A | Discharge status: Home / Self Care | Payer: Medicaid Other | Source: Ambulatory Visit | Attending: Neurology | Admitting: Neurology

## 2023-11-19 DIAGNOSIS — H543 Unqualified visual loss, both eyes: Secondary | ICD-10-CM

## 2023-11-19 DIAGNOSIS — Z87898 Personal history of other specified conditions: Secondary | ICD-10-CM

## 2023-11-19 DIAGNOSIS — H4710 Unspecified papilledema: Secondary | ICD-10-CM

## 2023-11-19 DIAGNOSIS — H93A9 Pulsatile tinnitus, unspecified ear: Secondary | ICD-10-CM

## 2023-11-19 DIAGNOSIS — H532 Diplopia: Secondary | ICD-10-CM | POA: Diagnosis not present

## 2023-11-19 DIAGNOSIS — R519 Headache, unspecified: Secondary | ICD-10-CM

## 2023-11-19 DIAGNOSIS — H5410 Blindness, one eye, low vision other eye, unspecified eyes: Secondary | ICD-10-CM | POA: Diagnosis not present

## 2023-11-19 DIAGNOSIS — O9279 Other disorders of lactation: Secondary | ICD-10-CM

## 2023-11-19 DIAGNOSIS — H547 Unspecified visual loss: Secondary | ICD-10-CM | POA: Diagnosis not present

## 2023-11-19 MED ORDER — GADOPICLENOL 0.5 MMOL/ML IV SOLN
10.0000 mL | Freq: Once | INTRAVENOUS | Status: AC | PRN
  Administered 2023-11-19: 10 mL via INTRAVENOUS

## 2023-12-03 NOTE — Progress Notes (Unsigned)
New patient visit   Patient: Shannon Galloway   DOB: 11-23-1996   27 y.o. Female  MRN: [id number] Visit Date: 12/04/2023  Today's healthcare provider: Alfredia Ferguson, PA-C   Chief Complaint  Patient presents with   Establish Care    Needing PCP- would like to talk about weight gain and not being able to lose States she has a lump or something in crease of leg- has history of ovarian cyst.(Right side) notices signs of ADHD, cannot focus.  She has notice a lot more acne lately, states it could be hormonal.   [facility]es [facility] Needing referral for OB.   Subjective    Shannon Galloway is a 27 y.o. female who presents today as a new patient to establish care.  Discussed the use of AI scribe software for clinical note transcription with the patient, who gave verbal consent to proceed.  History of Present Illness   Kimyah, a 27 year old patient with a known diagnosis of lupus, presents with a variety of symptoms that have been causing significant distress. The patient reports irregular menstrual cycles historically, but for the past six months they have been irregular with the last period occurring approximately a month and a half ago. This is a significant change from the patient's usual regular cycle. The patient denies sexual activity since April and is confident that pregnancy is not the cause of the menstrual irregularity.  In addition to the menstrual irregularity, the patient has experienced significant weight gain, despite efforts to maintain a healthy diet and regular exercise. The patient reports a current weight of 239 pounds, which is significantly higher than their usual weight. The patient was previously athletic, participating in gymnastics, cheerleading, and volleyball, and has never struggled with weight issues before.  The patient also reports new onset acne, which they have never experienced before, even during their teenage years. The patient has also been experiencing  sharp pelvic pain and has noticed two cysts in the groin area. They are not present today.   The patient also reports lactation, despite not being pregnant. Nipple discharge both spontaneous and expressed, bilaterally. She has had two prolactinoma work ups that have been negative. History of normal mammogram, but her rheumatologist recommended a repeat. The patient also reports always having positive pregnancy tests for the last 1-2 years despite never being pregnant. Last pelvic ultrasound was 04/2022.   In addition to the physical symptoms, the patient reports difficulty concentrating. She has a history of multiple drug use, depression, suicide attempt. She lost both her parents, grandparents at an early age and was in the foster care system. 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 No current psychologist or psychiatrist. She has a history of appropriately taking prescribed ADHD and depression meds.   Her new onset migraines are now controlled with topamax and she is followed by neurology.  She takes gabapentin for leg paresthesias.        Past Medical History:  Diagnosis Date   Chronic headaches    Lupus    Neck pain    POTS (postural orthostatic tachycardia syndrome)    Syncope    a. unclear etilogy. 10/2016: cardiac MRI and echo unremarkable.    Past Surgical History:  Procedure Laterality Date   EPIDURAL BLOOD PATCH  10/2023   KNEE SURGERY Right 12/31/2009   TONSILLECTOMY Bilateral    Family Status  Relation Name Status   Father  Deceased   Mother  Deceased   Sister  Alive  Older   Brother  Alive       Older   MGM  Deceased at age Unknown       Cause of death unknown   MGF  Deceased at age Unknown       Cause of death unknown   PGM  Alive   PGF  Deceased at age Unknown       Cause of death unknown   Other Adoptive Mother Alive   Other Adoptive Brother Alive       Younger/Younger  No partnership data on file   Family History  Adopted: Yes  Problem Relation Age  of Onset   Heart failure Father        Died at 60   Bipolar disorder Mother    Pneumonia Mother        Died at 10   Social History   Socioeconomic History   Marital status: Single    Spouse name: Not on file   Number of children: Not on file   Years of education: Not on file   Highest education level: Not on file  Occupational History   Not on file  Tobacco Use   Smoking status: Former    Current packs/day: 0.00    Average packs/day: 0.3 packs/day for 4.0 years (1.0 ttl pk-yrs)    Types: Cigarettes    Start date: 2018    Quit date: 2022    Years since quitting: 2.9   Smokeless tobacco: Never  Vaping Use   Vaping status: Every Day   Substances: Nicotine  Substance and Sexual Activity   Alcohol use: Not Currently    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   Drug use: Not Currently    Types: Marijuana, Codeine, Benzodiazepines    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   Sexual activity: Not Currently    Birth control/protection: None, Abstinence  Other Topics Concern   Not on file  Social History Narrative   Not on file   Social Determinants of Health   Financial Resource Strain: Low Risk  (09/18/2023)   Received from [facility]   Overall Financial Resource Strain (CARDIA)    Difficulty of Paying Living Expenses: Not hard at all  Food Insecurity: No Food Insecurity (09/18/2023)   Received from [facility]   Hunger Vital Sign    Worried About Running Out of Food in the Last Year: Never true    Ran Out of Food in the Last Year: Never true  Transportation Needs: No Transportation Needs (09/18/2023)   Received from [facility] - Transportation    In the past 12 months, has lack of transportation kept you from medical appointments or from getting medications?: No    Lack of Transportation (Non-Medical): No  Physical Activity: Insufficiently Active (10/12/2019)   Received from [facility] visits  prior to 03/02/2023., [facility] visits prior to 03/02/2023.   Exercise Vital Sign    Days of Exercise per Week: 3 days    Minutes of Exercise per Session: 30 min  Stress: No Stress Concern Present (10/12/2019)   Received from [facility] visits prior to 03/02/2023., [facility] visits prior to 03/02/2023.   [employer] - Occupational Stress Questionnaire    Feeling of Stress : Only a little  Social Connections: Unknown (05/14/2022)   Received from [facility], [facility]   Social Network  Social Network: Not on file   Outpatient Medications Prior to Visit  Medication Sig   BENLYSTA 200 MG/ML SOAJ Inject 200 mg into the skin once a week.   Cyanocobalamin (B-12 PO) Take by mouth.   gabapentin (NEURONTIN) 300 MG capsule Take 1 capsule by mouth at bedtime.   hydroxychloroquine (PLAQUENIL) 200 MG tablet Take 200 mg by mouth 2 (two) times daily.   topiramate (TOPAMAX) 100 MG tablet Take 1 tablet (100 mg total) by mouth at bedtime.   No facility-administered medications prior to visit.   No Known Allergies  Immunization History  Administered Date(s) Administered   DTP 02/26/1996, 06/04/1996, 07/23/1996   Dtap, Unspecified 08/23/1997, 03/24/2001   HIB, Unspecified 02/26/1996, 06/04/1996, 07/23/1996   HPV Quadrivalent 02/10/2013, 10/21/2013   Hep B, Unspecified [birth year], 1996, 01/27/1996, 10/01/1996   Hepatitis A, Ped/Adol-2 Dose 03/11/2013, 10/21/2013   IPV 02/26/1996, 06/04/1996   Influenza,inj,Quad PF,6+ Mos 10/12/2019   MMR 08/23/1997, 03/24/2001   Meningococcal Conjugate 03/11/2013   PPD Test 04/04/2021   Polio, Unspecified 08/23/1997, 03/24/2001   Tdap 09/20/2007   Varicella 03/24/2001    Health Maintenance  Topic Date Due   COVID-19 Vaccine (1) Never done   HPV VACCINES (3 - 3-dose series) 01/13/2014   Cervical Cancer Screening (Pap smear)  Never done   DTaP/Tdap/Td (7 - Td or  Tdap) 09/19/2017   INFLUENZA VACCINE  03/30/2024 (Originally 08/01/2023)   Hepatitis C Screening  Completed   HIV Screening  Completed    Patient Care Team: Alfredia Ferguson, PA-C as PCP - General (Physician Assistant)  Review of Systems  Constitutional:  Positive for unexpected weight change. Negative for fatigue and fever.  Respiratory:  Negative for cough and shortness of breath.   Cardiovascular:  Positive for leg swelling. Negative for chest pain.  Gastrointestinal:  Negative for abdominal pain.  Genitourinary:  Positive for menstrual problem.  Skin:  Positive for rash.       acne  Neurological:  Negative for dizziness and headaches.        Objective    BP 118/79   Pulse 86   Temp 98.3 F (36.8 C)   Ht 5\' 2"  (1.575 m)   Wt 239 lb 4 oz (108.5 kg)   LMP 10/16/2023 (Approximate)   SpO2 98%   BMI 43.76 kg/m     Physical Exam Constitutional:      General: She is awake.     Appearance: She is well-developed.  HENT:     Head: Normocephalic.  Eyes:     Conjunctiva/sclera: Conjunctivae normal.  Cardiovascular:     Rate and Rhythm: Normal rate and regular rhythm.     Heart sounds: Normal heart sounds.  Pulmonary:     Effort: Pulmonary effort is normal.     Breath sounds: Normal breath sounds.  Skin:    General: Skin is warm.     Comments: Old scarring in right groin from ingrown hair, no lymphadenopathy or mass appreciated  Neurological:     Mental Status: She is alert and oriented to person, place, and time.  Psychiatric:        Attention and Perception: Attention normal.        Mood and Affect: Mood normal.        Speech: Speech normal.        Behavior: Behavior is cooperative.     Depression Screen    12/04/2023   10:29 AM 12/22/2015    8:48 AM  PHQ 2/9 Scores  PHQ - 2 Score  0 3  PHQ- 9 Score  14   No results found for any visits on 12/04/23.  Assessment & Plan     Systemic lupus erythematosus, unspecified SLE type, unspecified organ involvement  status (HCC) Assessment & Plan: Managed by [facility].  Last UA + protein, last CMP kidney function normal/stable. Ordering uacr. Encourage pt to continue to document her symptoms and f/u with rheum  Orders: -     Microalbumin / creatinine urine ratio  Amenorrhea Assessment & Plan: Significant weight gain, change in menstrual cycle, acne in the last 4-6 months. LMP 1-2 mo ago. Not sexually active. Pt reporting positive urine pregnancy tests-- for 1 + year even w/ regular cycle Will check labs below.  Last pelvic US 04/2022 no masses or cysts appreciated.  Consider repeat, referring to GYN -- depending on timing of appt we can order vs GYN  Prolactinoma r/o already by neuro  Orders: -     Ambulatory referral to Gynecology -     TSH -     T4, free -     hCG, quantitative, pregnancy -     Testosterone -     Hemoglobin A1c -     17-Hydroxyprogesterone -     Luteinizing hormone  Attention deficit Assessment & Plan: Given h/o of depression, suicide attempt, no formal ADHD dx-- referring to psychiatry  Orders: -     Ambulatory referral to Psychiatry  Bilateral nipple discharge Assessment & Plan: Both spontaneous and expressed Ordering mammo/sono b/l to r/o intraductal papillomas  Prolactinoma r/o w/ MRI last prolactin level 10/2023 20.6    Orders: -     MM 3D DIAGNOSTIC MAMMOGRAM BILATERAL BREAST -     [place] LIMITED ULTRASOUND INCLUDING AXILLA LEFT BREAST  -     [place] LIMITED ULTRASOUND INCLUDING AXILLA RIGHT BREAST -     Luteinizing hormone  Skin lesion -in groin, nothing present today Advised pt to monitor  History of trauma Assessment & Plan: H/o of trauma, early parental death, drug use Referring to psych Pt is clean and in a clear mental space but would like to process her past. If psych does not set up with therapist, we can refer  Orders: -     Ambulatory referral to Psychiatry  37 minutes was spent with patient in office, another 10-15 minutes was spent  reviewing her chart-- other specialist appointments, imaging, labs.  Return if symptoms worsen or fail to improve, for will f/u pending labs.    Alfredia Ferguson, PA-C  [facility] [phone] (phone) [phone] (fax)  [facility]

## 2023-12-04 ENCOUNTER — Ambulatory Visit: Payer: Medicaid Other | Admitting: Physician Assistant

## 2023-12-04 ENCOUNTER — Encounter: Payer: Self-pay | Admitting: Physician Assistant

## 2023-12-04 VITALS — BP 118/79 | HR 86 | Temp 98.3°F | Ht 62.0 in | Wt 239.2 lb

## 2023-12-04 DIAGNOSIS — N6452 Nipple discharge: Secondary | ICD-10-CM | POA: Diagnosis not present

## 2023-12-04 DIAGNOSIS — Z87828 Personal history of other (healed) physical injury and trauma: Secondary | ICD-10-CM | POA: Insufficient documentation

## 2023-12-04 DIAGNOSIS — M329 Systemic lupus erythematosus, unspecified: Secondary | ICD-10-CM

## 2023-12-04 DIAGNOSIS — R41840 Attention and concentration deficit: Secondary | ICD-10-CM | POA: Diagnosis not present

## 2023-12-04 DIAGNOSIS — N912 Amenorrhea, unspecified: Secondary | ICD-10-CM | POA: Diagnosis not present

## 2023-12-04 DIAGNOSIS — F1911 Other psychoactive substance abuse, in remission: Secondary | ICD-10-CM | POA: Insufficient documentation

## 2023-12-04 DIAGNOSIS — L989 Disorder of the skin and subcutaneous tissue, unspecified: Secondary | ICD-10-CM

## 2023-12-04 DIAGNOSIS — Z9151 Personal history of suicidal behavior: Secondary | ICD-10-CM | POA: Insufficient documentation

## 2023-12-04 LAB — HEMOGLOBIN A1C: Hgb A1c MFr Bld: 5.4 % (ref 4.6–6.5)

## 2023-12-04 LAB — LUTEINIZING HORMONE: LH: 12.45 m[IU]/mL

## 2023-12-04 LAB — TSH: TSH: 1.95 u[IU]/mL (ref 0.35–5.50)

## 2023-12-04 LAB — HCG, QUANTITATIVE, PREGNANCY: Quantitative HCG: <0.6 m[IU]/mL

## 2023-12-04 LAB — MICROALBUMIN / CREATININE URINE RATIO
Creatinine,U: 279.4 mg/dL
Microalb Creat Ratio: 0.6 mg/g (ref 0.0–30.0)
Microalb, Ur: 1.8 mg/dL (ref 0.0–1.9)

## 2023-12-04 LAB — TESTOSTERONE: Testosterone: 50.2 ng/dL — ABNORMAL HIGH (ref 15.00–40.00)

## 2023-12-04 LAB — T4, FREE: Free T4: 0.87 ng/dL (ref 0.60–1.60)

## 2023-12-04 NOTE — Assessment & Plan Note (Signed)
Given h/o of depression, suicide attempt, no formal ADHD dx-- referring to psychiatry

## 2023-12-04 NOTE — Assessment & Plan Note (Addendum)
Significant weight gain, change in menstrual cycle, acne in the last 4-6 months. LMP 1-2 mo ago. Not sexually active. Pt reporting positive urine pregnancy tests-- for 1 + year even w/ regular cycle Will check labs below.  Last pelvic US 04/2022 no masses or cysts appreciated.  Consider repeat, referring to GYN -- depending on timing of appt we can order vs GYN  Prolactinoma r/o already by neuro

## 2023-12-04 NOTE — Assessment & Plan Note (Signed)
Managed by [facility].  Last UA + protein, last CMP kidney function normal/stable. Ordering uacr. Encourage pt to continue to document her symptoms and f/u with rheum

## 2023-12-04 NOTE — Assessment & Plan Note (Signed)
Both spontaneous and expressed Ordering mammo/sono b/l to r/o intraductal papillomas  Prolactinoma r/o w/ MRI last prolactin level 10/2023 20.6

## 2023-12-04 NOTE — Assessment & Plan Note (Addendum)
H/o of trauma, early parental death, drug use Referring to psych Pt is clean and in a clear mental space but would like to process her past. If psych does not set up with therapist, we can refer

## 2023-12-05 ENCOUNTER — Other Ambulatory Visit: Payer: Self-pay | Admitting: Physician Assistant

## 2023-12-05 DIAGNOSIS — R7989 Other specified abnormal findings of blood chemistry: Secondary | ICD-10-CM | POA: Insufficient documentation

## 2023-12-05 DIAGNOSIS — Z8742 Personal history of other diseases of the female genital tract: Secondary | ICD-10-CM

## 2023-12-05 DIAGNOSIS — N912 Amenorrhea, unspecified: Secondary | ICD-10-CM

## 2023-12-08 LAB — 17-HYDROXYPROGESTERONE: 17-OH-Progesterone, LC/MS/MS: 88 ng/dL

## 2023-12-09 ENCOUNTER — Ambulatory Visit (HOSPITAL_BASED_OUTPATIENT_CLINIC_OR_DEPARTMENT_OTHER)
Admission: RE | Admit: 2023-12-09 | Discharge: 2023-12-09 | Disposition: A | Discharge status: Home / Self Care | Payer: Medicaid Other | Source: Ambulatory Visit | Attending: Physician Assistant | Admitting: Physician Assistant

## 2023-12-09 ENCOUNTER — Ambulatory Visit (HOSPITAL_BASED_OUTPATIENT_CLINIC_OR_DEPARTMENT_OTHER): Payer: Medicaid Other

## 2023-12-09 DIAGNOSIS — R7989 Other specified abnormal findings of blood chemistry: Secondary | ICD-10-CM | POA: Diagnosis present

## 2023-12-09 DIAGNOSIS — N912 Amenorrhea, unspecified: Secondary | ICD-10-CM | POA: Diagnosis present

## 2023-12-09 DIAGNOSIS — Z8742 Personal history of other diseases of the female genital tract: Secondary | ICD-10-CM | POA: Diagnosis present

## 2023-12-11 ENCOUNTER — Encounter: Payer: Self-pay | Admitting: Physician Assistant

## 2023-12-20 ENCOUNTER — Telehealth: Payer: Self-pay | Admitting: Neurology

## 2023-12-20 ENCOUNTER — Ambulatory Visit: Payer: Medicaid Other | Admitting: Neurology

## 2023-12-20 NOTE — Progress Notes (Deleted)
[facility]    Provider:  Dr Lucia Gaskins Requesting Provider: No ref. provider found Primary Care Provider:  Alfredia Ferguson, PA-C  CC:  IDIOPATHIC INTRACRANIAL HYPERTENSION  12/20/2023: MRI brain and orbits completed since last appointment normal.   HPI:  Shannon Galloway is a 27 y.o. female here as requested by No ref. provider found for optic nerve head edema. has Bipolar 2 disorder, major depressive episode (HCC); Systemic lupus erythematosus (HCC); History of suicide attempt; History of drug abuse in remission (HCC); Amenorrhea; Bilateral nipple discharge; Attention deficit; History of trauma; and Elevated testosterone level in female on their problem list. She has RA and Lupus.   Pressue headaches. Hospitalized 9/11 started in August.never had headaches. No personal or family hx of migraines. Went to the D for headaches. Had a thorough evaluation including MRI brain, CT Venogram, Lumbar puncture (cannot find opening pressure), EMG/NCS. She has visionchanges, enlarged blind spots bilaterally, ophthalmology showed mild optic nerve head edema. Gained a lot of weight. Pressure in a band around the head. Just a lot of pressure, neck pain, vision changes, since the lumbar puncture she has spinal tap headache and continues to have headaches on standing likely spinal tap headache will see if we can send to GI for a blood patch. Need opening pressure. Hydroxyxloroquine causing edema? Has ringing in the ears and pulsatile tinnitus, diplopia, vision loss, headache, neck pain, pulasting in the ears. She has gained a lot of weight.No other focal neurologic deficits, associated symptoms, inciting events or modifiable factors.  Reviewed notes, labs and imaging from outside physicians, which showed:  09/13/2023: MRI BRAIN WITHOUT AND WITH CONTRAST   INDICATION: Headache, chronic, new features or increased frequency, M32.8  Other forms of systemic lupus erythematosus (CMS/HHS-HCC)    COMPARISON: Noncontrast CT head dated 09/11/2023   TECHNIQUE/PROTOCOL: Standard adult brain protocol without and with IV  contrast.   FINDINGS:  Brain Parenchyma:  No hemorrhage, cerebral edema, acute cortical  infarction, mass, mass effect, or midline shift.  No abnormal enhancement.  Ventricles and Sulci: Normal for age.   Extra-Axial Spaces: No extra-axial fluid collection.  Basal Cisterns: Normal.  Intracranial Flow-Voids: Normal.   Paranasal Sinuses: Normal.  Mastoid air cells: well aerated  Orbits: Normal.  Cranium: Normal.  Visualized upper cervical spine: No high grade stenosis.   IMPRESSION:  Normal brain MRI   TECHNIQUE: Axial images obtained from base of skull to the vertex with  intravenous contrast in the venous phase. 2-D and 3-D reformations are  created.   COMPARISON: No prior study available for comparison.   FINDINGS: The dural venous structures are normal. The superior sagittal  sinus, transverse sinuses, and sigmoid sinuses are normal. Cortical venous  structures are unremarkable. Cavernous sinuses are normal bilaterally.   IMPRESSION: No pathologic findings. No evidence of venous thrombosis or  occlusion. No obvious stenosis identified.   Review of Systems: Patient complains of symptoms per HPI as well as the following symptoms headache. Pertinent negatives and positives per HPI. All others negative.   Social History   Socioeconomic History   Marital status: Single    Spouse name: Not on file   Number of children: Not on file   Years of education: Not on file   Highest education level: Not on file  Occupational History   Not on file  Tobacco Use   Smoking status: Former    Current packs/day: 0.00    Average packs/day: 0.3 packs/day for 4.0 years (1.0 ttl pk-yrs)    Types:  Cigarettes    Start date: 2018    Quit date: 2022    Years since quitting: 2.9   Smokeless tobacco: Never  Vaping Use   Vaping status: Every Day   Substances:  Nicotine  Substance and Sexual Activity   Alcohol use: Not Currently    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   Drug use: Not Currently    Types: Marijuana, Codeine, Benzodiazepines    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   Sexual activity: Not Currently    Birth control/protection: None, Abstinence  Other Topics Concern   Not on file  Social History Narrative   Not on file   Social Drivers of Health   Financial Resource Strain: Low Risk  (09/18/2023)   Received from [facility]   Overall Financial Resource Strain (CARDIA)    Difficulty of Paying Living Expenses: Not hard at all  Food Insecurity: No Food Insecurity (09/18/2023)   Received from [facility]   Hunger Vital Sign    Worried About Running Out of Food in the Last Year: Never true    Ran Out of Food in the Last Year: Never true  Transportation Needs: No Transportation Needs (09/18/2023)   Received from [facility] - Transportation    In the past 12 months, has lack of transportation kept you from medical appointments or from getting medications?: No    Lack of Transportation (Non-Medical): No  Physical Activity: Insufficiently Active (10/12/2019)   Received from [facility] visits prior to 03/02/2023., [facility] visits prior to 03/02/2023.   Exercise Vital Sign    Days of Exercise per Week: 3 days    Minutes of Exercise per Session: 30 min  Stress: No Stress Concern Present (10/12/2019)   Received from [facility] visits prior to 03/02/2023., [facility] visits prior to 03/02/2023.   [employer] [facility] [employer] - Occupational Stress Questionnaire    Feeling of Stress : Only a little  Social Connections: Unknown (05/14/2022)   Received from [facility]   Social Network    Social Network: Not on file  Intimate Partner Violence: Unknown (04/05/2022)   Received  from [facility], [facility]   HITS    Physically Hurt: Not on file    Insult or Talk Down To: Not on file    Threaten Physical Harm: Not on file    Scream or Curse: Not on file    Family History  Adopted: Yes  Problem Relation Age of Onset   Heart failure Father        Died at 33   Bipolar disorder Mother    Pneumonia Mother        Died at 86    Past Medical History:  Diagnosis Date   Chronic headaches    Lupus    Neck pain    POTS (postural orthostatic tachycardia syndrome)    Syncope    a. unclear etilogy. 10/2016: cardiac MRI and echo unremarkable.     Patient Active Problem List   Diagnosis Date Noted   Elevated testosterone level in female 12/05/2023   History of suicide attempt 12/04/2023   History of drug abuse in remission (HCC) 12/04/2023   Amenorrhea 12/04/2023   Bilateral nipple discharge 12/04/2023   Attention deficit 12/04/2023   History of trauma 12/04/2023   Systemic lupus erythematosus (HCC)    Bipolar 2 disorder, major  depressive episode (HCC) 06/05/2017    Past Surgical History:  Procedure Laterality Date   EPIDURAL BLOOD PATCH  10/2023   KNEE SURGERY Right 12/31/2009   TONSILLECTOMY Bilateral     Current Outpatient Medications  Medication Sig Dispense Refill   BENLYSTA 200 MG/ML SOAJ Inject 200 mg into the skin once a week.     Cyanocobalamin (B-12 PO) Take by mouth.     gabapentin (NEURONTIN) 300 MG capsule Take 1 capsule by mouth at bedtime.     hydroxychloroquine (PLAQUENIL) 200 MG tablet Take 200 mg by mouth 2 (two) times daily.     topiramate (TOPAMAX) 100 MG tablet Take 1 tablet (100 mg total) by mouth at bedtime. 90 tablet 3   No current facility-administered medications for this visit.    Allergies as of 12/20/2023   (No Known Allergies)    Vitals: LMP 10/16/2023 (Approximate)  Last Weight:  Wt Readings from Last 1 Encounters:  12/04/23 239 lb 4 oz (108.5 kg)   Last Height:   Ht Readings from Last 1 Encounters:   12/04/23 5\' 2"  (1.575 m)     Physical exam: Exam: Gen: NAD, conversant, well nourised, obese, well groomed                     CV: RRR, no MRG. No Carotid Bruits. No peripheral edema, warm, nontender Eyes: Conjunctivae clear without exudates or hemorrhage  Neuro: Detailed Neurologic Exam  Speech:    Speech is normal; fluent and spontaneous with normal comprehension.  Cognition:    The patient is oriented to person, place, and time;     recent and remote memory intact;     language fluent;     normal attention, concentration,     fund of knowledge Cranial Nerves:    The pupils are equal, round, and reactive to light. Elevated ONHs? Visual fields are full to finger confrontation. Extraocular movements are intact. Trigeminal sensation is intact and the muscles of mastication are normal. The face is symmetric. The palate elevates in the midline. Hearing intact. Voice is normal. Shoulder shrug is normal. The tongue has normal motion without fasciculations.   Coordination: nml  Gait: nml  Motor Observation:    No asymmetry, no atrophy, and no involuntary movements noted. Tone:    Normal muscle tone.    Posture:    Posture is normal. normal erect    Strength:    Strength is V/V in the upper and lower limbs.      Sensation: intact to LT     Reflex Exam:  DTR's:    Deep tendon reflexes in the upper and lower extremities are normal bilaterally.   Toes:    The toes are downgoing bilaterally.   Clonus:    Clonus is absent.    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 Also recent optometrist exam with "ONH edema and enlarged blind spots". She was evaluated extensively at [facility] with opening pressure of only 17. Needs further evaluation:  Send to Dr. Dione Booze for ophthalmologic evaluation. Most recently Patient Saw optometrist who stated mild ONH edema and VF testing showed enlarged blind spots but [facility] prior stated  normal. Would like someone I trust to evaluate her for elevated optic nerves/papilledema/OCT and VF testing. Opening pressure was only 17 but she has all the characteristics of IDIOPATHIC INTRACRANIAL HYPERTENSION and although her disk margins are crisp I may see some ONH elevation on exam. Thank you for taking a  look! Was on plaquenil, now stopped - could this be the cause? 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 and nipple discharge ongoing from breasts; MRA of the head for pulsatile tinnitis Prolactin level, expresses liquid from breasts; Has not been to endocrinology for her pituitary tumor and nipple discharge from breasts will refer Has not had a mammogram for years for her nipple discharge (not pregnant) and does not have a pcp will order and see if I can ask a colleague to see her deficiencies in vitamins B1, B2, B9, and B12 can cause optic nerve head swelling and other visual disturbances, check bloodwork Blood patch for spinal headache: since LP has headache worse with standing, better with laying Patient should be on short-term disability until work up complete due to very concerning symptoms as above Was on plaquenil, now stopped - could this be the cause? See ophthalmology  Discussed:  Other causes of optic nerve head swelling, also known as papilledema, include: High intracranial pressure  Tumors Infections, bleeding, or inflammation in the brain or meninges Cerebral venous sinus thrombosis (CTV negative) Iron-deficiency anemia Medication usage? Plaquenil?  No orders of the defined types were placed in this encounter.  No orders of the defined types were placed in this encounter.  Discussed:   Idiopathic intracranial hypertension (IIH)   Idiopathic Intracranial Hypertension  Idiopathic intracranial hypertension (IIH) is a condition that increases pressure  around the brain. The fluid that surrounds the brain and spinal cord (cerebrospinal fluid, or CSF) increases and causes the pressure. Idiopathic means that the cause of this condition is not known. IIH affects the brain and spinal cord. If this condition is not treated, it can cause vision loss or blindness. What are the causes? The cause of this condition is not known. What increases the risk? The following factors may make you more likely to develop this condition: Being obese. Being a person who is female, between the ages of 44 and 65 years old, and who has not gone through menopause. Taking certain medicines, such as birth control, acne medicines, or steroids. What are the signs or symptoms? Symptoms of this condition include: Headaches. This is the most common symptom. Brief periods of total blindness. Double vision, blurred vision, or poor side (peripheral) vision. Pain in the shoulders or neck. Nausea and vomiting. A sound like rushing water or a pulsing sound within the ears (pulsatile tinnitus), or ringing in the ears. How is this diagnosed? This condition may be diagnosed based on: Your symptoms and medical history. Imaging tests of the brain, such as: CT scan. MRI. Magnetic resonance venogram (MRV) to check the veins. Diagnostic lumbar puncture. This is a procedure to remove and examine a sample of CSF. This procedure can determine whether your fluid pressure is too high. An eye exam to check for swelling or nerve damage in the eyes. How is this treated? Treatment for this condition depends on the symptoms. The goal of treatment is to decrease the pressure around your brain. Common treatments include: Weight loss through healthy eating, salt restriction, and exercise, if you are overweight. Medicines to decrease the production of CSF and lower the pressure within your skull. Medicines to prevent or treat headaches. Other treatments may include: Surgery to place drains  (shunts) in your brain to remove extra fluid. Lumbar puncture to remove extra CSF. Follow these instructions at home: If you are overweight or obese, work with your  health care provider to lose weight. Take over-the-counter and prescription medicines only as told by your health care provider. Ask your health care provider if the medicine prescribed to you requires you to avoid driving or using machinery. Do not use any products that contain nicotine or tobacco. These products include cigarettes, chewing tobacco, and vaping devices, such as e-cigarettes. If you need help quitting, ask your health care provider. Keep all follow-up visits. Your health care provider will need to monitor you regularly. Contact a health care provider if: You have changes in your vision, such as: Double vision. Blurred vision. Poor peripheral vision. Get help right away if: You have any of the following symptoms and they get worse or do not get better: Headaches. Nausea. Vomiting. Sudden trouble seeing. This information is not intended to replace advice given to you by your health care provider. Make sure you discuss any questions you have with your health care provider. Document Revised: 05/15/2022 Document Reviewed: 04/24/2022 Elsevier Patient Education  2024 Elsevier Inc.  For any acute change especially worsening headache or vision loss call 911 and proceed to ED  To prevent or relieve headaches, try the following: Cool Compress. Lie down and place a cool compress on your head.  Avoid headache triggers. If certain foods or odors seem to have triggered your migraines in the past, avoid them. A headache diary might help you identify triggers.  Include physical activity in your daily routine. Try a daily walk or other moderate aerobic exercise.  Manage stress. Find healthy ways to cope with the stressors, such as delegating tasks on your to-do list.  Practice relaxation techniques. Try deep breathing, yoga,  massage and visualization.  Eat regularly. Eating regularly scheduled meals and maintaining a healthy diet might help prevent headaches. Also, drink plenty of fluids.  Follow a regular sleep schedule. Sleep deprivation might contribute to headaches Consider biofeedback. With this mind-body technique, you learn to control certain bodily functions -- such as muscle tension, heart rate and blood pressure -- to prevent headaches or reduce headache pain.    Proceed to emergency room if you experience new or worsening symptoms or symptoms do not resolve, if you have new neurologic symptoms or if headache is severe, or for any concerning symptom.   Provided education and documentation from American headache Society toolbox including articles on: pseudotumoer cerebri(IIH), chronic migraine medication overuse headache, chronic migraines, prevention of migraines and other headaches, behavioral and other nonpharmacologic treatments for headache.    No orders of the defined types were placed in this encounter.  No orders of the defined types were placed in this encounter.   Cc: No ref. provider found,  Alfredia Ferguson, PA-C  Naomie Dean, MD  [facility] [address] [place], [place] [address]  Phone [phone] Fax [phone]  I spent 75 minutes of face-to-face and non-face-to-face time with patient on the  No diagnosis found.  diagnosis.  This included previsit chart review, lab review, study review, order entry, electronic health record documentation, patient education on the different diagnostic and therapeutic options, counseling and coordination of care, risks and benefits of management, compliance, or risk factor reduction

## 2023-12-20 NOTE — Telephone Encounter (Signed)
Pt states an emergency came up as to why she needed to cancel her appointment.  She has been r/s with wait list

## 2023-12-29 ENCOUNTER — Encounter: Payer: Self-pay | Admitting: Physician Assistant

## 2023-12-30 NOTE — Telephone Encounter (Signed)
Called patient to get in office appointment scheduled. VM was full. Will send mychart message for patient to call and get an appointment scheduled.

## 2023-12-31 ENCOUNTER — Telehealth: Payer: Self-pay

## 2023-12-31 ENCOUNTER — Ambulatory Visit: Payer: Medicaid Other | Admitting: Physician Assistant

## 2023-12-31 ENCOUNTER — Encounter: Payer: Self-pay | Admitting: Physician Assistant

## 2023-12-31 DIAGNOSIS — M329 Systemic lupus erythematosus, unspecified: Secondary | ICD-10-CM | POA: Diagnosis not present

## 2023-12-31 DIAGNOSIS — R635 Abnormal weight gain: Secondary | ICD-10-CM

## 2023-12-31 DIAGNOSIS — E669 Obesity, unspecified: Secondary | ICD-10-CM | POA: Insufficient documentation

## 2023-12-31 DIAGNOSIS — L819 Disorder of pigmentation, unspecified: Secondary | ICD-10-CM | POA: Diagnosis not present

## 2023-12-31 MED ORDER — WEGOVY 0.25 MG/0.5ML ~~LOC~~ SOAJ
0.2500 mg | SUBCUTANEOUS | 0 refills | Status: DC
Start: 2023-12-31 — End: 2024-02-20

## 2023-12-31 NOTE — Assessment & Plan Note (Signed)
May be 2/2 to lupus, seems too extensive for raynauds. Pictures included . Pt reports rheum has been aware, and her symptoms only have progressed-- referring to vascular

## 2023-12-31 NOTE — Telephone Encounter (Signed)
PA initiated via Covermymeds; KEY: [id number]. Awaiting determination.

## 2023-12-31 NOTE — Assessment & Plan Note (Addendum)
 Pt has symptoms of pcos-- oligomenorrhea, high testosterone , acne, weight gain, facial hair, but all other labs negative. Has f/u scheduled with obgyn  Recommending 24 hour urine cortisol to r/o cushing's.  Discussed starting a weight loss agent, wegovy . Advised SE, acid reflux, nausea, MOA.  Will start with 0.25 mg weekly x 4 weeks, then increase to 0.5 mg weekly

## 2023-12-31 NOTE — Progress Notes (Signed)
 Established patient visit   Patient: Shannon Galloway   DOB: 12-05-1996   27 y.o. Female  MRN: [id number] Visit Date: 12/31/2023  Today's healthcare provider: Manuelita Flatness, PA-C   Chief Complaint  Patient presents with   Medical Management of Chronic Issues    Acne-  Weight loss- ADHD- has been on meds in the past. States she cannot focus.  Leg discoloration- some numbness and pain when she stands up for a while   Subjective     Discussed the use of AI scribe software for clinical note transcription with the patient, who gave verbal consent to proceed.  History of Present Illness   The patient, with a known history of lupus, presents with multiple complaints. The patient reports significant weight gain despite being on a diet and exercising more than ever. The patient also reports continued worsening of acne and facial hair-- above her lip. The patient expresses distress over these changes, to the point of not wanting to leave the house.  In addition to the weight gain and skin changes, the patient reports difficulty focusing. The patient describes having to read a paragraph multiple times before being able to understand it, as their mind tends to wander to other topics. This has been particularly challenging as the patient is currently in school.  The patient also reports leg discoloration, describing it as turning purple and feeling like it's on fire. The patient notes that this can occur even when not exposed to extreme temperatures, such as when standing and putting on eyelashes. The patient describes the sensation as stinging and itching.      Medications: Outpatient Medications Prior to Visit  Medication Sig   BENLYSTA 200 MG/ML SOAJ Inject 200 mg into the skin once a week.   Cyanocobalamin (B-12 PO) Take by mouth.   gabapentin (NEURONTIN) 300 MG capsule Take 1 capsule by mouth at bedtime.   hydroxychloroquine (PLAQUENIL) 200 MG tablet Take 200 mg by mouth 2 (two)  times daily.   topiramate  (TOPAMAX ) 100 MG tablet Take 1 tablet (100 mg total) by mouth at bedtime.   No facility-administered medications prior to visit.   Review of Systems  Constitutional:  Positive for unexpected weight change. Negative for fatigue and fever.  Respiratory:  Negative for cough and shortness of breath.   Cardiovascular:  Negative for chest pain and leg swelling.  Gastrointestinal:  Negative for abdominal pain.  Skin:  Positive for rash.  Neurological:  Negative for dizziness and headaches.        Objective    BP 120/70   Pulse 93   Temp 98.1 F (36.7 C) (Oral)   Ht 5' 2 (1.575 m)   Wt 243 lb 4 oz (110.3 kg)   LMP 12/10/2023 (Approximate)   SpO2 95%   BMI 44.49 kg/m    Physical Exam Vitals reviewed.  Constitutional:      Appearance: She is not ill-appearing.  HENT:     Head: Normocephalic.  Eyes:     Conjunctiva/sclera: Conjunctivae normal.  Cardiovascular:     Rate and Rhythm: Normal rate.  Pulmonary:     Effort: Pulmonary effort is normal. No respiratory distress.  Neurological:     General: No focal deficit present.     Mental Status: She is alert and oriented to person, place, and time.  Psychiatric:        Mood and Affect: Mood normal.        Behavior: Behavior normal.  No results found for any visits on 12/31/23.  Assessment & Plan    Morbid obesity (HCC) Assessment & Plan: Pt has symptoms of pcos-- oligomenorrhea, high testosterone , acne, weight gain, facial hair, but all other labs negative. Has f/u scheduled with obgyn  Recommending 24 hour urine cortisol to r/o cushing's.  Discussed starting a weight loss agent, wegovy . Advised SE, acid reflux, nausea, MOA.  Will start with 0.25 mg weekly x 4 weeks, then increase to 0.5 mg weekly  Orders: -     Wegovy ; Inject 0.25 mg into the skin once a week.  Dispense: 2 mL; Refill: 0  Weight gain -     Cortisol, urine, free  Discoloration of skin of lower leg Assessment &  Plan: May be 2/2 to lupus, seems too extensive for raynauds. Pictures included . Pt reports rheum has been aware, and her symptoms only have progressed-- referring to vascular  Orders: -     Ambulatory referral to Vascular Surgery  Systemic lupus erythematosus, unspecified SLE type, unspecified organ involvement status (HCC) -     Ambulatory referral to Vascular Surgery    Return if symptoms worsen or fail to improve.       Manuelita Flatness, PA-C  [facility] [phone] (phone) [phone] (fax)  [facility]

## 2023-12-31 NOTE — Telephone Encounter (Signed)
Pt was seen in office today

## 2024-01-02 NOTE — Telephone Encounter (Signed)
 PA approved.   Message from plan: PA Case: [id number], Status: Approved, Coverage Starts on: 12/31/2023 12:00:00 AM, Coverage Ends on: 06/28/2024 12:00:00 AM.. Authorization Expiration Date: June 28, 2024.

## 2024-01-06 ENCOUNTER — Encounter: Payer: Self-pay | Admitting: Physician Assistant

## 2024-01-07 ENCOUNTER — Other Ambulatory Visit: Payer: Self-pay | Admitting: Physician Assistant

## 2024-01-07 ENCOUNTER — Other Ambulatory Visit: Payer: Medicaid Other

## 2024-01-07 DIAGNOSIS — Z0283 Encounter for blood-alcohol and blood-drug test: Secondary | ICD-10-CM

## 2024-01-07 NOTE — Addendum Note (Signed)
 Addended byAlfredia Ferguson on: 01/07/2024 11:49 AM   Modules accepted: Orders

## 2024-01-08 ENCOUNTER — Other Ambulatory Visit: Payer: Medicaid Other

## 2024-01-08 DIAGNOSIS — Z0283 Encounter for blood-alcohol and blood-drug test: Secondary | ICD-10-CM

## 2024-01-08 DIAGNOSIS — R635 Abnormal weight gain: Secondary | ICD-10-CM

## 2024-01-08 NOTE — Addendum Note (Signed)
 Addended by: Clearnce Sorrel on: 01/08/2024 04:05 PM   Modules accepted: Orders

## 2024-01-08 NOTE — Addendum Note (Signed)
 Addended by: Clearnce Sorrel on: 01/08/2024 04:04 PM   Modules accepted: Orders

## 2024-01-10 ENCOUNTER — Telehealth: Payer: Self-pay | Admitting: Emergency Medicine

## 2024-01-10 ENCOUNTER — Other Ambulatory Visit: Payer: Self-pay | Admitting: Physician Assistant

## 2024-01-10 ENCOUNTER — Encounter: Payer: Self-pay | Admitting: Physician Assistant

## 2024-01-10 MED ORDER — WEGOVY 0.5 MG/0.5ML ~~LOC~~ SOAJ
0.5000 mg | SUBCUTANEOUS | 0 refills | Status: DC
Start: 2024-01-20 — End: 2024-02-20

## 2024-01-10 NOTE — Telephone Encounter (Signed)
 Let patient know we are waiting on labs first and then we would send in

## 2024-01-10 NOTE — Telephone Encounter (Signed)
 Copied from CRM [id number]. Topic: Clinical - Medication Question >> Jan 10, 2024 12:13 PM Corean SAUNDERS wrote: Reason for CRM: Patient is inquiring about when her ADHD medication will be filled by Manuelita Flatness as patient starts school on Monday and was looking to have it prior to her school start date.

## 2024-01-12 LAB — CORTISOL, URINE, FREE
Cortisol (Ur), Free: 6 ug/(24.h) (ref 6–42)
Cortisol,F,ug/L,U: 10 ug/L

## 2024-01-12 LAB — DRUG MONITORING PANEL 376104, URINE
Amphetamines: NEGATIVE ng/mL (ref <500)
Barbiturates: NEGATIVE ng/mL (ref <300)
Benzodiazepines: NEGATIVE ng/mL (ref <100)
Cocaine Metabolite: NEGATIVE ng/mL (ref <150)
Desmethyltramadol: NEGATIVE ng/mL (ref <100)
Opiates: NEGATIVE ng/mL (ref <100)
Oxycodone: NEGATIVE ng/mL (ref <100)
Tramadol: NEGATIVE ng/mL (ref <100)

## 2024-01-12 LAB — DM TEMPLATE

## 2024-01-13 ENCOUNTER — Other Ambulatory Visit: Payer: Self-pay | Admitting: Physician Assistant

## 2024-01-13 DIAGNOSIS — R41840 Attention and concentration deficit: Secondary | ICD-10-CM

## 2024-01-13 MED ORDER — AMPHETAMINE-DEXTROAMPHET ER 10 MG PO CP24: 10.0000 mg | ORAL_CAPSULE | Freq: Every day | ORAL | 0 refills | Status: DC

## 2024-01-15 ENCOUNTER — Other Ambulatory Visit: Payer: Self-pay | Admitting: *Deleted

## 2024-01-15 DIAGNOSIS — L819 Disorder of pigmentation, unspecified: Secondary | ICD-10-CM

## 2024-01-17 ENCOUNTER — Ambulatory Visit (HOSPITAL_COMMUNITY)
Admission: RE | Admit: 2024-01-17 | Discharge: 2024-01-17 | Disposition: A | Discharge status: Home / Self Care | Payer: Medicaid Other | Source: Ambulatory Visit | Attending: Vascular Surgery | Admitting: Vascular Surgery

## 2024-01-17 DIAGNOSIS — L819 Disorder of pigmentation, unspecified: Secondary | ICD-10-CM | POA: Insufficient documentation

## 2024-01-24 ENCOUNTER — Encounter: Payer: Self-pay | Admitting: Physician Assistant

## 2024-01-24 ENCOUNTER — Ambulatory Visit (INDEPENDENT_AMBULATORY_CARE_PROVIDER_SITE_OTHER): Payer: Medicaid Other | Admitting: Physician Assistant

## 2024-01-24 VITALS — BP 99/67 | HR 88 | Temp 98.7°F | Resp 18 | Ht 62.0 in | Wt 237.8 lb

## 2024-01-24 DIAGNOSIS — I872 Venous insufficiency (chronic) (peripheral): Secondary | ICD-10-CM

## 2024-01-24 NOTE — Progress Notes (Signed)
[facility] [place]   Reason for referral: swollen and painful legs  History of Present Illness  Shannon Galloway is a 28 y.o. female who presents with chief complaint:  swelling and painful legs. Patient notes, onset of swelling over 2 years ago.  She says usually when she first gets up it is okay but as the day goes on it progresses. She also has stinging and burning pain. Heaviness and aching in her thighs and legs. She also gets blotchy white and purple spots on her legs.  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 She is currently in cosmetology school. The patient has had no history of DVT, no history of varicose vein, no history of venous stasis ulcers, no history of  Lymphedema and no history of skin changes in lower legs.  There is no family history of venous disorders- both of her parents passed away in their 50's.  The patient has used OTC thigh high compression stockings in the past but felt they did not help. She does elevate but admittedly not above the level of her heart.   Past Medical History:  Diagnosis Date   Chronic headaches    Lupus    Neck pain    POTS (postural orthostatic tachycardia syndrome)    Syncope    a. unclear etilogy. 10/2016: cardiac MRI and echo unremarkable.     Past Surgical History:  Procedure Laterality Date   EPIDURAL BLOOD PATCH  10/2023   KNEE SURGERY Right 12/31/2009   TONSILLECTOMY Bilateral     Social History   Socioeconomic History   Marital status: Single    Spouse name: Not on file   Number of children: Not on file   Years of education: Not on file   Highest education level: Not on file  Occupational History   Not on file  Tobacco Use   Smoking status: Former    Current packs/day: 0.00    Average packs/day: 0.3 packs/day for 4.0 years (1.0 ttl pk-yrs)    Types: Cigarettes    Start date: 2018    Quit  date: 2022    Years since quitting: 3.0   Smokeless tobacco: Never  Vaping Use   Vaping status: Every Day   Substances: Nicotine  Substance and Sexual Activity   Alcohol use: Not Currently    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   Drug use: Not Currently    Types: Marijuana, Codeine, Benzodiazepines    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   Sexual activity: Not Currently    Birth control/protection: None, Abstinence  Other Topics Concern   Not on file  Social History Narrative   Not on file   Social Drivers of Health   Financial Resource Strain: Low Risk  (09/18/2023)   Received from [facility]   Overall Financial Resource Strain (CARDIA)    Difficulty of Paying Living Expenses: Not hard at all  Food Insecurity: No Food Insecurity (09/18/2023)   Received from [facility]   Hunger Vital Sign    Worried About Running Out of Food in the Last Year: Never true    Ran Out of Food in the Last Year: Never true  Transportation Needs: No Transportation Needs (09/18/2023)   Received from [facility]   PRAPARE - Transportation    In the past 12 months, has  lack of transportation kept you from medical appointments or from getting medications?: No    Lack of Transportation (Non-Medical): No  Physical Activity: Insufficiently Active (10/12/2019)   Received from [facility] visits prior to 03/02/2023., [facility] visits prior to 03/02/2023.   Exercise Vital Sign    Days of Exercise per Week: 3 days    Minutes of Exercise per Session: 30 min  Stress: No Stress Concern Present (10/12/2019)   Received from [facility] visits prior to 03/02/2023., [facility] visits prior to 03/02/2023.   [employer] of Occupational Health - Occupational Stress Questionnaire    Feeling of Stress : Only a little  Social Connections: Unknown (05/14/2022)   Received from [facility],  [facility]   Social Network    Social Network: Not on file  Intimate Partner Violence: Unknown (04/05/2022)   Received from [facility], [facility]   HITS    Physically Hurt: Not on file    Insult or Talk Down To: Not on file    Threaten Physical Harm: Not on file    Scream or Curse: Not on file    Family History  Adopted: Yes  Problem Relation Age of Onset   Heart failure Father        Died at 68   Bipolar disorder Mother    Pneumonia Mother        Died at 19    Current Outpatient Medications on File Prior to Visit  Medication Sig Dispense Refill   amphetamine-dextroamphetamine (ADDERALL XR) 10 MG 24 hr capsule Take 1 capsule (10 mg total) by mouth daily. 30 capsule 0   BENLYSTA 200 MG/ML SOAJ Inject 200 mg into the skin once a week.     Cyanocobalamin (B-12 PO) Take by mouth.     gabapentin (NEURONTIN) 300 MG capsule Take 1 capsule by mouth at bedtime.     hydroxychloroquine (PLAQUENIL) 200 MG tablet Take 200 mg by mouth 2 (two) times daily.     Semaglutide-Weight Management (WEGOVY) 0.25 MG/0.5ML SOAJ Inject 0.25 mg into the skin once a week. 2 mL 0   Semaglutide-Weight Management (WEGOVY) 0.5 MG/0.5ML SOAJ Inject 0.5 mg into the skin once a week. Start after 4 weeks of 0.25 mg 6 mL 0   topiramate (TOPAMAX) 100 MG tablet Take 1 tablet (100 mg total) by mouth at bedtime. 90 tablet 3   No current facility-administered medications on file prior to visit.    Allergies as of 01/24/2024   (No Known Allergies)     ROS:   General:  No weight loss, Fever, chills  HEENT: No recent headaches, no nasal bleeding, no visual changes, no sore throat  Neurologic: No dizziness, blackouts, seizures. No recent symptoms of stroke or mini- stroke. No recent episodes of slurred speech, or temporary blindness.  Cardiac: No recent episodes of chest pain/pressure, no shortness of breath at rest.  No shortness of breath with exertion.  Denies history of atrial fibrillation or irregular  heartbeat  Vascular: No history of rest pain in feet.  No history of claudication.  No history of non-healing ulcer, No history of DVT   Pulmonary: No home oxygen, no productive cough, no hemoptysis,  No asthma or wheezing  Musculoskeletal:  [ ]  Arthritis, [ ]  Low back pain,  [ ]  Joint pain  Hematologic:No history of hypercoagulable state.  No history of easy bleeding.  No history of anemia  Gastrointestinal: No  hematochezia or melena,  No gastroesophageal reflux, no trouble swallowing  Urinary: [ ]  chronic Kidney disease, [ ]  on HD - [ ]  MWF or [ ]  TTHS, [ ]  Burning with urination, [ ]  Frequent urination, [ ]  Difficulty urinating;   Skin: No rashes  Psychological: No history of anxiety,  No history of depression  Physical Examination  Vitals:   01/24/24 0841  BP: 99/67  Pulse: 88  Resp: 18  Temp: 98.7 F (37.1 C)  TempSrc: Temporal  SpO2: 99%  Weight: 237 lb 12.8 oz (107.9 kg)  Height: 5\' 2"  (1.575 m)    Body mass index is 43.49 kg/m.  General:  Alert and oriented, no acute distress HEENT: Normal Neck: No bruit or JVD Pulmonary: Clear to auscultation bilaterally Cardiac: Regular Rate and Rhythm without murmur Abdomen: Soft, non-tender, non-distended, no mass, no scars Skin: No rash Extremity Pulses:  2+ radial, brachial, femoral, dorsalis pedis, posterior tibial pulses bilaterally Musculoskeletal: No deformity or edema  Neurologic: Upper and lower extremity motor 5/5 and symmetric  DATA: +--------------+---------+------+-----------+------------+--------+  LEFT         Reflux NoRefluxReflux TimeDiameter cmsComments                          Yes                                   +--------------+---------+------+-----------+------------+--------+  CFV                    yes   >1 second                       +--------------+---------+------+-----------+------------+--------+  FV mid        no                                               +--------------+---------+------+-----------+------------+--------+  Popliteal    no                                              +--------------+---------+------+-----------+------------+--------+  GSV at SFJ              yes    >500 ms      .802              +--------------+---------+------+-----------+------------+--------+  GSV prox thighno                            .540              +--------------+---------+------+-----------+------------+--------+  GSV mid thigh no                            .630              +--------------+---------+------+-----------+------------+--------+  GSV dist thighno                            .584              +--------------+---------+------+-----------+------------+--------+  GSV at  knee   no                            .621              +--------------+---------+------+-----------+------------+--------+  GSV prox calf no                            .494              +--------------+---------+------+-----------+------------+--------+  GSV mid calf  no                            .377              +--------------+---------+------+-----------+------------+--------+  SSV Pop Fossa no                            .318              +--------------+---------+------+-----------+------------+--------+  SSV prox calf no                            .250              +--------------+---------+------+-----------+------------+--------+   Summary:  Left:  - No evidence of deep vein thrombosis seen in the left lower extremity, from the common femoral through the popliteal veins.  - No evidence of superficial venous thrombosis in the left lower extremity.  - Venous reflux is noted in the left common femoral vein.  - Venous reflux is noted in the left sapheno-femoral junction.     Assessment/Plan: Venous reflux with swelling and pain. Her duplex shows deep left CFV and SFJ. She does not have DVT or  SVT. No reflux in the SSV. Her GSV is large however she does not have extensive reflux beyond the junction. Based on today's duplex findings would not recommend any venous lazer ablation. I think her symptoms are likely multifactorial. Certainly some of her aching, burning, stinging can be related to her chronic venous insufficiency but I suspect that the extent of her pain and aching may be more related to her Lupus. 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 This is commonly related to Lupus. Advised her to follow up with her Rheumatologist but in the interim keep her legs warm. Continue smoking cessation and reduce stress as much as possible.  - Recommend daily elevation of 20-30 minutes above level of heart, daily compression stocking use, exercise, weight reduction, refraining from prolonged sitting or standing. - She was measured and fitted for knee high 15-20 mmHg compression stockings - She was provided with vein information sheet at today's visit - She will follow up as needed if any new or worsening symptoms   Rami Budhu PA-C [facility] [place] Office: [phone]  MD in clinic [place]

## 2024-01-29 ENCOUNTER — Encounter: Payer: Self-pay | Admitting: Neurology

## 2024-01-30 ENCOUNTER — Telehealth: Payer: Self-pay | Admitting: Neurology

## 2024-01-30 NOTE — Telephone Encounter (Signed)
Shannon Galloway spoke with the patient directly after these messages were sent. See phone note.

## 2024-01-30 NOTE — Telephone Encounter (Signed)
I called pt.  She states she has been having symptoms,  she is not sure if lupus related.  She c/o more fatigue, dizziness, nausea, blurry vision, severe back pain, could not lift left leg last night, c/o headaches severe last night, she was crying, these started 2-3 days ago. No fever.  She then relayed that her friend at cosmetology school  last night  gave her a neck massage, as she was having neck/ pain, and then she noted puffy area where she had her LP (this back in 9/30-24 and then blood patch 10/01/2023).  She did go to urgent care on 01-27-2024 gave her meclizine. Checked for covid, flu, rsv negative.  She is drinking / hydrated but has no appetite.  I told her that not sure what was going on, she can see her pcp, or go to ED for eval since can do imaging.  She is taking topamax 100mg  po daily.  She appreciated call back.

## 2024-01-30 NOTE — Telephone Encounter (Signed)
Pt sent a MyChart message yesterday at  10:04 pm regarding. dizziness and nausea, severe back pain. Went to urgent care Monday, 01/27/24 for dizziness and nausea. They prescribed Meclizine 12.5 mg tablet and Ondansetron 4 mg. Would like a call back to discuss if need to go to the emergency room.  Inform

## 2024-02-07 ENCOUNTER — Ambulatory Visit: Payer: Medicaid Other | Admitting: Obstetrics and Gynecology

## 2024-02-07 ENCOUNTER — Other Ambulatory Visit (HOSPITAL_COMMUNITY)
Admission: RE | Admit: 2024-02-07 | Discharge: 2024-02-07 | Disposition: A | Discharge status: Home / Self Care | Payer: Medicaid Other | Source: Ambulatory Visit | Attending: Obstetrics and Gynecology | Admitting: Obstetrics and Gynecology

## 2024-02-07 VITALS — BP 108/86 | HR 69 | Ht 62.0 in | Wt 234.0 lb

## 2024-02-07 DIAGNOSIS — Z124 Encounter for screening for malignant neoplasm of cervix: Secondary | ICD-10-CM

## 2024-02-07 DIAGNOSIS — M791 Myalgia, unspecified site: Secondary | ICD-10-CM | POA: Diagnosis not present

## 2024-02-07 DIAGNOSIS — N939 Abnormal uterine and vaginal bleeding, unspecified: Secondary | ICD-10-CM

## 2024-02-07 DIAGNOSIS — R7989 Other specified abnormal findings of blood chemistry: Secondary | ICD-10-CM

## 2024-02-07 MED ORDER — DROSPIRENONE-ETHINYL ESTRADIOL 3-0.02 MG PO TABS: 1.0000 | ORAL_TABLET | Freq: Every day | ORAL | 11 refills | Status: DC

## 2024-02-07 NOTE — Progress Notes (Signed)
 NEW GYNECOLOGY PATIENT Patient name: Shannon Galloway MRN [id number]  Date of birth: 1996-06-10 Chief Complaint:   Gynecologic Exam (Referred by PCP for severe abdominal pain x 3months.  Patient not sexually active. Not using any birth control.  Patient states menses have been irregular./Patient has Lupus)     History:  Shannon Galloway is a 28 y.o. G0P0000 being seen today for AUB.  Since turning 28 yo noticed having a lot of changes. Menses used to be very regular and now will have no menses for up to 2 months. Not currently sexually active x 1.5 -2 years. Now not sure when menses will start. Now having acne and weight gain. Had testing with primary care and testosterone  was very high. Will also have intermittent pain RLQ and stabbing sensation when it occurs and pushing on the area witll make it feel better. No medication changes when these symptoms. All new medications were started about 6 weeks ago. Started wegovy  recently.  Has been on plaquenil for 5-6 years; hips are flared up will get steroid injectiosnin her hips.  More stressed currently but not starting before all of this. Has been about 12 years since birth control; used nexplanon  and at 28 yo had copper IUD for maybe a week after it felt      Gynecologic History No LMP recorded. Contraception: abstinence Last Pap: No results found for: DIAGPAP, HPVHIGH, ADEQPAP  High Risk HPV: Positive  Adequacy:  Satisfactory for evaluation, transformation zone component PRESENT  Diagnosis:  Atypical squamous cells of undetermined significance (ASC-US )  Last Mammogram: n/a Last Colonoscopy: n/a  Obstetric History OB History  Gravida Para Term Preterm AB Living  0 0 0 0 0 0  SAB IAB Ectopic Multiple Live Births  0 0 0 0     Past Medical History:  Diagnosis Date   Chronic headaches    Lupus    Neck pain    POTS (postural orthostatic tachycardia syndrome)    Syncope    a. unclear etilogy. 10/2016: cardiac MRI and echo  unremarkable.     Past Surgical History:  Procedure Laterality Date   EPIDURAL BLOOD PATCH  10/2023   KNEE SURGERY Right 12/31/2009   TONSILLECTOMY Bilateral     Current Outpatient Medications on File Prior to Visit  Medication Sig Dispense Refill   amphetamine -dextroamphetamine (ADDERALL XR) 10 MG 24 hr capsule Take 1 capsule (10 mg total) by mouth daily. 30 capsule 0   BENLYSTA 200 MG/ML SOAJ Inject 200 mg into the skin once a week.     Cyanocobalamin (B-12 PO) Take by mouth.     gabapentin (NEURONTIN) 300 MG capsule Take 1 capsule by mouth at bedtime.     hydroxychloroquine (PLAQUENIL) 200 MG tablet Take 200 mg by mouth 2 (two) times daily.     Semaglutide -Weight Management (WEGOVY ) 0.25 MG/0.5ML SOAJ Inject 0.25 mg into the skin once a week. 2 mL 0   Semaglutide -Weight Management (WEGOVY ) 0.5 MG/0.5ML SOAJ Inject 0.5 mg into the skin once a week. Start after 4 weeks of 0.25 mg 6 mL 0   topiramate  (TOPAMAX ) 100 MG tablet Take 1 tablet (100 mg total) by mouth at bedtime. 90 tablet 3   No current facility-administered medications on file prior to visit.    No Known Allergies  Social History:  reports that she quit smoking about 3 years ago. Her smoking use included cigarettes. She started smoking about 7 years ago. She has a 1 pack-year smoking history. She has never  used smokeless tobacco. She reports that she does not currently use alcohol. She reports that she does not currently use drugs after having used the following drugs: Marijuana, Codeine, and Benzodiazepines.  Family History  Adopted: Yes  Problem Relation Age of Onset   Heart failure Father        Died at 2   Bipolar disorder Mother    Pneumonia Mother        Died at 51    The following portions of the patient's history were reviewed and updated as appropriate: allergies, current medications, past family history, past medical history, past social history, past surgical history and problem list.  Review of  Systems Pertinent items noted in HPI and remainder of comprehensive ROS otherwise negative.  Physical Exam:  BP 108/86   Pulse 69   Ht 5' 2 (1.575 m)   Wt 234 lb (106.1 kg)   BMI 42.80 kg/m  Physical Exam Vitals and nursing note reviewed. Exam conducted with a chaperone present.  Constitutional:      Appearance: Normal appearance.  Pulmonary:     Effort: Pulmonary effort is normal.  Abdominal:     Palpations: Abdomen is soft.  Genitourinary:    General: Normal vulva.     Exam position: Lithotomy position.     Comments: Normal appearing vulva Normal vulvar sensation bilaterally Nontender superficial pelvic floor muscles Nontender ischial tuberosities bilaterally  Allodynia at introitus: No  Anal wink not present Posterior vaginal wall tender Right levator ani 8/10 Right ischiococcygeous 8/10 Right obturator internus 8/10 Left levator ani 8/10 Left ischioccocygeous 7/10 Left obturator internus 8/10 Anterior vaginal wall tender    Neurological:     Mental Status: She is alert.       Assessment and Plan:   1. Abnormal uterine bleeding (AUB) (Primary) Trial of anti-androgenic OCP for management of bleeding. Noted will have to see how well OCP works in context of wegovy  use. Follow response in 3 months to OCP.   The use of the oral contraceptive has been fully discussed with the patient. This includes the proper method to initiate (i.e. Sunday start after next normal menstrual onset versus same day start) and continue the pills, the need for regular compliance to ensure adequate contraceptive effect, the physiology which make the pill effective, the instructions for what to do in event of a missed pill, and warnings about anticipated minor side effects such as breakthrough spotting, nausea, breast tenderness, weight changes, acne, headaches, etc.  They have been told of the more serious potential side effects such as MI, stroke, and deep vein thrombosis, all of which are  very unlikely.  They have been asked to report any signs of such serious problems immediately.  They should back up the pill with a condom during any cycle in which antibiotics are prescribed, and during the first cycle as well. The need for additional protection, such as a condom, to prevent exposure to sexually transmitted diseases has also been discussed- the patient has been clearly reminded that OCP's cannot protect them against diseases such as HIV and others. They understand and wish to take the medication as prescribed. They was given a prescription for  and will return in 3 months for BP and contraceptive check.  - drospirenone -ethinyl estradiol  (YAZ) 3-0.02 MG tablet; Take 1 tablet by mouth daily.  Dispense: 28 tablet; Refill: 11  2. Elevated testosterone  level in female  - drospirenone -ethinyl estradiol  (YAZ) 3-0.02 MG tablet; Take 1 tablet by mouth daily.  Dispense:  28 tablet; Refill: 11  3. Screening for cervical cancer Pap collected today - Cytology - PAP  4. Myalgia PFPT referral - Ambulatory referral to Physical Therapy    Routine preventative health maintenance measures emphasized. Please refer to After Visit Summary for other counseling recommendations.   Follow-up: No follow-ups on file.      Carter Quarry, MD Obstetrician & Gynecologist, [facility] [employer], [facility]

## 2024-02-11 ENCOUNTER — Ambulatory Visit (HOSPITAL_COMMUNITY): Payer: Medicaid Other | Admitting: Psychiatry

## 2024-02-11 ENCOUNTER — Encounter (HOSPITAL_COMMUNITY): Payer: Self-pay | Admitting: Psychiatry

## 2024-02-11 DIAGNOSIS — Z8659 Personal history of other mental and behavioral disorders: Secondary | ICD-10-CM

## 2024-02-11 DIAGNOSIS — Z9151 Personal history of suicidal behavior: Secondary | ICD-10-CM | POA: Diagnosis not present

## 2024-02-11 DIAGNOSIS — F4001 Agoraphobia with panic disorder: Secondary | ICD-10-CM

## 2024-02-11 DIAGNOSIS — F4310 Post-traumatic stress disorder, unspecified: Secondary | ICD-10-CM | POA: Diagnosis not present

## 2024-02-11 DIAGNOSIS — F411 Generalized anxiety disorder: Secondary | ICD-10-CM | POA: Diagnosis not present
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
## 2024-02-11 NOTE — Patient Instructions (Signed)
We added Cymbalta (duloxetine) 20 mg once daily to your regimen today.  This should begin to help with the anxiety and trauma symptoms.  I also placed a referral to psychotherapy in our clinic to be on the lookout for a phone call from my front desk and they should be able to get you scheduled.  I will also coordinate with your PCP to get a nutrition referral to make sure we are getting the nutrition you need while you are on the [facility].

## 2024-02-12 ENCOUNTER — Other Ambulatory Visit: Payer: Self-pay | Admitting: Physician Assistant

## 2024-02-12 DIAGNOSIS — Z8659 Personal history of other mental and behavioral disorders: Secondary | ICD-10-CM

## 2024-02-13 LAB — CYTOLOGY - PAP
Chlamydia: NEGATIVE
Comment: NEGATIVE
Comment: NEGATIVE
Comment: NORMAL
Diagnosis: NEGATIVE
Neisseria Gonorrhea: NEGATIVE
Trichomonas: NEGATIVE

## 2024-02-14 ENCOUNTER — Encounter: Payer: Self-pay | Admitting: Obstetrics and Gynecology

## 2024-02-16 ENCOUNTER — Other Ambulatory Visit: Payer: Self-pay | Admitting: Physician Assistant

## 2024-02-16 DIAGNOSIS — R41840 Attention and concentration deficit: Secondary | ICD-10-CM

## 2024-02-18 MED ORDER — AMPHETAMINE-DEXTROAMPHET ER 10 MG PO CP24: 10.0000 mg | ORAL_CAPSULE | Freq: Every day | ORAL | 0 refills | Status: DC

## 2024-02-20 ENCOUNTER — Other Ambulatory Visit: Payer: Self-pay | Admitting: Physician Assistant

## 2024-02-20 MED ORDER — WEGOVY 0.5 MG/0.5ML ~~LOC~~ SOAJ: 0.5000 mg | SUBCUTANEOUS | 1 refills | Status: DC

## 2024-03-11 ENCOUNTER — Ambulatory Visit: Payer: Medicaid Other | Admitting: Registered"

## 2024-03-13 ENCOUNTER — Telehealth (HOSPITAL_COMMUNITY): Payer: Medicaid Other | Admitting: Psychiatry

## 2024-03-27 ENCOUNTER — Other Ambulatory Visit: Payer: Self-pay | Admitting: Physician Assistant

## 2024-03-27 DIAGNOSIS — R41840 Attention and concentration deficit: Secondary | ICD-10-CM

## 2024-03-27 MED ORDER — AMPHETAMINE-DEXTROAMPHET ER 10 MG PO CP24: 10.0000 mg | ORAL_CAPSULE | Freq: Every day | ORAL | 0 refills | Status: DC

## 2024-03-27 NOTE — Telephone Encounter (Signed)
 Requesting: Adderall XR 10mg   Contract: 01/08/24 UDS: 01/08/24 Last Visit: 12/04/23 Next Visit: None Last Refill: 02/18/24 #30 and 0RF   Please Advise

## 2024-03-31 ENCOUNTER — Telehealth (HOSPITAL_COMMUNITY): Payer: Self-pay | Admitting: *Deleted

## 2024-03-31 ENCOUNTER — Ambulatory Visit: Payer: Self-pay

## 2024-03-31 ENCOUNTER — Telehealth (HOSPITAL_COMMUNITY): Payer: Self-pay | Admitting: Psychiatry

## 2024-03-31 ENCOUNTER — Ambulatory Visit (INDEPENDENT_AMBULATORY_CARE_PROVIDER_SITE_OTHER): Admitting: Physician Assistant

## 2024-03-31 ENCOUNTER — Encounter (HOSPITAL_COMMUNITY): Payer: Self-pay | Admitting: *Deleted

## 2024-03-31 ENCOUNTER — Encounter: Payer: Self-pay | Admitting: Physician Assistant

## 2024-03-31 ENCOUNTER — Encounter (HOSPITAL_COMMUNITY): Payer: Self-pay

## 2024-03-31 VITALS — BP 137/82 | HR 116 | Ht 62.0 in | Wt 223.6 lb

## 2024-03-31 DIAGNOSIS — F411 Generalized anxiety disorder: Secondary | ICD-10-CM

## 2024-03-31 DIAGNOSIS — F4310 Post-traumatic stress disorder, unspecified: Secondary | ICD-10-CM

## 2024-03-31 DIAGNOSIS — F4001 Agoraphobia with panic disorder: Secondary | ICD-10-CM | POA: Diagnosis not present

## 2024-03-31 MED ORDER — CLONAZEPAM 0.5 MG PO TABS: 0.5000 mg | ORAL_TABLET | Freq: Two times a day (BID) | ORAL | 0 refills | Status: DC | PRN

## 2024-03-31 NOTE — Telephone Encounter (Signed)
  Chief Complaint: panic attack last night Symptoms: poor sleep, overwhelmed,   Frequency: last night (almost called 911) Pertinent Negatives: Patient denies SI Disposition: [] ED /[] Urgent Care (no appt availability in office) / [x] Appointment(In office/virtual)/ []  [facility]/ [] Home Care/ [] Refused Recommended Disposition /[] [facility]/ []  Follow-up with PCP Additional Notes: pt given appt for today with PCP.   Copied from CRM [id number]. Topic: Clinical - Pink Word Triage >> Mar 31, 2024 12:15 PM Adaysia C wrote: Reason for Triage: Patient is experiencing a mild panic attack and has asked for advise from a nurse; patient warm transferred to nurse triage Reason for Disposition . [1] Panic attack symptoms (diagnosed in the past) AND [2] not better with usual treatment, reassurance, or Care Advice  Answer Assessment - Initial Assessment Questions 1. CONCERN: "Did anything happen that prompted you to call today?"      Panic attack, chemo exhaustion, poor sleep past month and a half, poor appetite   2. ANXIETY SYMPTOMS: "Can you describe how you (your loved one; patient) have been feeling?" (e.g., tense, restless, panicky, anxious, keyed up, overwhelmed, sense of impending doom).      Very stressed, anxious , overwhelmed , nauseated, body feels weak  3. ONSET: "How long have you been feeling this way?" (e.g., hours, days, weeks)     *No Answer* 4. SEVERITY: "How would you rate the level of anxiety?" (e.g., 0 - 10; or mild, moderate, severe).     severe 5. FUNCTIONAL IMPAIRMENT: "How have these feelings affected your ability to do daily activities?" "Have you had more difficulty than usual doing your normal daily activities?" (e.g., getting better, same, worse; self-care, school, work, interactions)     *No Answer* 6. HISTORY: "Have you felt this way before?" "Have you ever been diagnosed with an anxiety problem in the past?" (e.g., generalized anxiety disorder, panic  attacks, PTSD). If Yes, ask: "How was this problem treated?" (e.g., medicines, counseling, etc.)     Yes- counseling 7. RISK OF HARM - SUICIDAL IDEATION: "Do you ever have thoughts of hurting or killing yourself?" If Yes, ask:  "Do you have these feelings now?" "Do you have a plan on how you would do this?"     no 8. TREATMENT:  "What has been done so far to treat this anxiety?" (e.g., medicines, relaxation strategies). "What has helped?"     Meds relaxation techniques  9. TREATMENT - THERAPIST: "Do you have a counselor or therapist? Name?"     No  10. POTENTIAL TRIGGERS: "Do you drink caffeinated beverages (e.g., coffee, colas, teas), and how much daily?" "Do you drink alcohol or use any drugs?" "Have you started any new medicines recently?"       Uncle murdered last month no family loss of finances 56. PATIENT SUPPORT: "Who is with you now?" "Who do you live with?" "Do you have family or friends who you can talk to?"        *No Answer* 12. OTHER SYMPTOMS: "Do you have any other symptoms?" (e.g., feeling depressed, trouble concentrating, trouble sleeping, trouble breathing, palpitations or fast heartbeat, chest pain, sweating, nausea, or diarrhea)       Had PA last attack  Protocols used: Anxiety and Panic Attack-A-AH

## 2024-03-31 NOTE — Progress Notes (Signed)
 Established patient visit   Patient: Shannon Galloway   DOB: March 07, 1996   28 y.o. Female  MRN: [id number] Visit Date: 03/31/2024  Today's healthcare provider: Alfredia Ferguson, PA-C   Cc. Panic attacks, acute anxiety   Subjective     Discussed the use of AI scribe software for clinical note transcription with the patient, who gave verbal consent to proceed.  History of Present Illness   The patient presents with severe anxiety and panic attacks. She describes these episodes as feeling like she is going to die, with symptoms including chest pain, shaking, numbness in the hands, and vomiting. The patient reports that these symptoms have been occurring for the past few days and have been severe enough to consider calling 911. She attributes the onset of these symptoms to a series of stressful life events, including the murder of a family member, financial difficulties due to a halt in disability payments, and academic pressures from attending school full time.       Medications: Outpatient Medications Prior to Visit  Medication Sig   cycloSPORINE (RESTASIS) 0.05 % ophthalmic emulsion Apply to eye.   folic acid (FOLVITE) 1 MG tablet Take by mouth.   methotrexate 50 MG/2ML injection Inject into the skin.   norethindrone (AYGESTIN) 5 MG tablet Take 5 mg by mouth daily.   ulipristal acetate (ELLA) 30 MG tablet Take by mouth.   acetaminophen (TYLENOL) 500 MG tablet Take 500 mg by mouth every 6 (six) hours as needed for headache.   amphetamine-dextroamphetamine (ADDERALL XR) 10 MG 24 hr capsule Take 1 capsule (10 mg total) by mouth daily.   BENLYSTA 200 MG/ML SOAJ Inject 200 mg into the skin once a week.   Cyanocobalamin (B-12 PO) Take by mouth.   gabapentin (NEURONTIN) 300 MG capsule Take 1 capsule by mouth at bedtime.   hydroxychloroquine (PLAQUENIL) 200 MG tablet Take 200 mg by mouth 2 (two) times daily.   Semaglutide-Weight Management (WEGOVY) 0.5 MG/0.5ML SOAJ Inject 0.5 mg into  the skin once a week. Start after 4 weeks of 0.25 mg   topiramate (TOPAMAX) 100 MG tablet Take 1 tablet (100 mg total) by mouth at bedtime.   VENTOLIN HFA 108 (90 Base) MCG/ACT inhaler Inhale 2 puffs into the lungs every 4 (four) hours as needed.   [DISCONTINUED] drospirenone-ethinyl estradiol (YAZ) 3-0.02 MG tablet Take 1 tablet by mouth daily.   No facility-administered medications prior to visit.    Review of Systems  Constitutional:  Negative for fatigue and fever.  Respiratory:  Negative for cough and shortness of breath.   Cardiovascular:  Negative for chest pain and leg swelling.  Gastrointestinal:  Negative for abdominal pain.  Neurological:  Negative for dizziness and headaches.  Psychiatric/Behavioral:  Positive for agitation. Negative for suicidal ideas. The patient is nervous/anxious.        Objective    BP 137/82   Pulse (!) 116   Ht 5\' 2"  (1.575 m)   Wt 223 lb 9.6 oz (101.4 kg)   BMI 40.90 kg/m    Physical Exam Vitals reviewed.  Constitutional:      Appearance: She is not ill-appearing.  HENT:     Head: Normocephalic.  Eyes:     Conjunctiva/sclera: Conjunctivae normal.  Cardiovascular:     Rate and Rhythm: Normal rate.  Pulmonary:     Effort: Pulmonary effort is normal. No respiratory distress.  Neurological:     Mental Status: She is alert and oriented to person, place, and  time.  Psychiatric:        Mood and Affect: Mood is anxious. Affect is tearful.        Behavior: Behavior is withdrawn.        Thought Content: Thought content normal.     No results found for any visits on 03/31/24.  Assessment & Plan    PTSD (post-traumatic stress disorder) -     Ambulatory referral to Psychology  Generalized anxiety disorder -     Ambulatory referral to Psychology  Panic disorder with agoraphobia -     Ambulatory referral to Psychology -     clonazePAM; Take 1 tablet (0.5 mg total) by mouth 2 (two) times daily as needed for anxiety.  Dispense: 20 tablet;  Refill: 0  -reviewed some panic coping mechanisms in office with patient  - Prescribe Klonopin to be taken twice daily as needed to manage panic attacks. - Refer to therapy for additional support in managing anxiety and panic disorder. - Coordinate with psychiatrist Dr. Adrian Blackwater for follow-up and long-term management of panic disorder.    Return if symptoms worsen or fail to improve.       Alfredia Ferguson, PA-C  [facility] [phone] (phone) [phone] (fax)  [facility]

## 2024-03-31 NOTE — Telephone Encounter (Signed)
 Opened in Error.

## 2024-04-14 ENCOUNTER — Other Ambulatory Visit: Payer: Self-pay | Admitting: Physician Assistant

## 2024-04-14 ENCOUNTER — Encounter: Payer: Self-pay | Admitting: Physician Assistant

## 2024-04-14 DIAGNOSIS — F4001 Agoraphobia with panic disorder: Secondary | ICD-10-CM

## 2024-04-14 MED ORDER — CLONAZEPAM 0.5 MG PO TABS: 0.5000 mg | ORAL_TABLET | Freq: Two times a day (BID) | ORAL | 0 refills | Status: DC | PRN

## 2024-04-16 ENCOUNTER — Telehealth (HOSPITAL_COMMUNITY): Payer: Self-pay | Admitting: Psychiatry

## 2024-04-17 ENCOUNTER — Telehealth (HOSPITAL_COMMUNITY): Payer: Self-pay | Admitting: Psychiatry

## 2024-04-17 ENCOUNTER — Encounter (HOSPITAL_COMMUNITY): Payer: Self-pay

## 2024-04-22 ENCOUNTER — Other Ambulatory Visit: Payer: Self-pay | Admitting: Physician Assistant

## 2024-04-22 MED ORDER — WEGOVY 0.5 MG/0.5ML ~~LOC~~ SOAJ: 0.5000 mg | SUBCUTANEOUS | 1 refills | Status: DC

## 2024-04-22 NOTE — Telephone Encounter (Signed)
 Copied from CRM [id number]. Topic: Clinical - Medication Refill >> Apr 22, 2024 12:52 PM Earnestine Goes B wrote: Most Recent Primary Care Visit:  Provider: Trenton Frock  Department: [facility]  Visit Type: ACUTE  Date: 03/31/2024  Medication: Semaglutide -Weight Management (WEGOVY ) 0.5 MG/0.5ML SOAJ  Has the patient contacted their pharmacy? Yes (Agent: If no, request that the patient contact the pharmacy for the refill. If patient does not wish to contact the pharmacy document the reason why and proceed with request.) (Agent: If yes, when and what did the pharmacy advise?)  Is this the correct pharmacy for this prescription? Yes If no, delete pharmacy and type the correct one.  This is the patient's preferred pharmacy:  [id number] - [place], [place] - [address] [place] [address] Phone: [phone] Fax: [phone]   Has the prescription been filled recently? Yes  Is the patient out of the medication? Yes  Has the patient been seen for an appointment in the last year OR does the patient have an upcoming appointment? Yes  Can we respond through MyChart? Yes  Agent: Please be advised that Rx refills may take up to 3 business days. We ask that you follow-up with your pharmacy.

## 2024-05-01 ENCOUNTER — Ambulatory Visit: Attending: Obstetrics and Gynecology | Admitting: Physical Therapy

## 2024-05-06 ENCOUNTER — Encounter: Payer: Self-pay | Admitting: Neurology

## 2024-05-06 ENCOUNTER — Ambulatory Visit: Payer: Medicaid Other | Admitting: Neurology

## 2024-05-06 NOTE — Progress Notes (Deleted)
 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   MRI brain normal MRI orbits normal MRA head normal  HPI 10/23/2023:  Shannon Galloway is a 28 y.o. female here as requested by Trenton Frock, PA-C for optic nerve head edema. has PTSD (post-traumatic stress disorder); Systemic lupus erythematosus (HCC); History of suicide attempt; History of drug abuse in remission (HCC); Amenorrhea; Bilateral nipple discharge; Attention deficit; History of trauma; Elevated testosterone  level in female; Morbid obesity (HCC); Discoloration of skin of lower leg; Generalized anxiety disorder; Panic disorder with agoraphobia; and History of ADHD on their problem list. She has RA and Lupus.   Pressue headaches. Hospitalized 9/11 started in August.never had headaches. No personal or family hx of migraines. Went to the D for headaches. Had a thorough evaluation including MRI brain, CT Venogram, Lumbar puncture (cannot find opening pressure), EMG/NCS. She has visionchanges, enlarged blind spots bilaterally, ophthalmology showed mild optic nerve head edema. Gained a lot of weight. Pressure in a band around the head. Just a lot of pressure, neck pain, vision changes, since the lumbar puncture she has spinal tap headache and continues to have headaches on standing likely spinal tap headache will see if we can send to GI for a blood patch. Need opening pressure. Hydroxyxloroquine causing edema? Has ringing in the ears and pulsatile tinnitus, diplopia, vision loss, headache, neck pain, pulasting in the  ears. She has gained a lot of weight.No other focal neurologic deficits, associated symptoms, inciting events or modifiable factors.  Reviewed notes, labs and imaging from outside physicians, which showed:  09/13/2023: MRI BRAIN WITHOUT AND WITH CONTRAST   INDICATION: Headache, chronic, new features or increased frequency, M32.8  Other forms of systemic lupus erythematosus (CMS/HHS-HCC)   COMPARISON: Noncontrast CT head dated 09/11/2023   TECHNIQUE/PROTOCOL: Standard adult brain protocol without and with IV  contrast.   FINDINGS:  Brain Parenchyma:  No hemorrhage, cerebral edema, acute cortical  infarction, mass, mass effect, or midline shift.  No abnormal enhancement.  Ventricles and Sulci: Normal for age.   Extra-Axial Spaces: No extra-axial fluid collection.  Basal Cisterns: Normal.  Intracranial Flow-Voids: Normal.   Paranasal Sinuses: Normal.  Mastoid air cells: well aerated  Orbits: Normal.  Cranium: Normal.  Visualized upper cervical spine: No high grade stenosis.   IMPRESSION:  Normal brain MRI   TECHNIQUE: Axial images obtained from base of skull to the vertex with  intravenous contrast in the venous phase. 2-D and 3-D reformations are  created.   COMPARISON: No prior study available for comparison.   FINDINGS: The dural venous structures are normal. The superior sagittal  sinus, transverse sinuses, and sigmoid sinuses are normal. Cortical venous  structures are unremarkable. Cavernous sinuses are normal bilaterally.   IMPRESSION: No pathologic findings. No evidence of venous thrombosis or  occlusion. No obvious stenosis identified.   Review of Systems: Patient complains of symptoms per HPI as well as the following symptoms headache. Pertinent negatives and positives per HPI. All others negative.   Social History   Socioeconomic History   Marital status: Single    Spouse name: Not on file   Number of children: Not on file  Years of education: Not on file    Highest education level: Not on file  Occupational History   Not on file  Tobacco Use   Smoking status: Former    Current packs/day: 0.00    Average packs/day: 0.3 packs/day for 4.0 years (1.0 ttl pk-yrs)    Types: Cigarettes, E-cigarettes    Start date: 2018    Quit date: 2022    Years since quitting: 3.3   Smokeless tobacco: Never  Vaping Use   Vaping status: Every Day   Substances: Nicotine   Substance and Sexual Activity   Alcohol use: Not Currently    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 See psychiatry note from 02/11/2024   Sexual activity: Not Currently    Birth control/protection: None, Abstinence  Other Topics Concern   Not on file  Social History Narrative   Not on file   Social Drivers of Health   Financial Resource Strain: Low Risk  (09/18/2023)   Received from [facility]   Overall Financial Resource Strain (CARDIA)    Difficulty of Paying Living Expenses: Not hard at all  Food Insecurity: No Food Insecurity (09/18/2023)   Received from [facility]   Hunger Vital Sign    Worried About Running Out of Food in the Last Year: Never true    Ran Out of Food in the Last Year: Never true  Transportation Needs: No Transportation Needs (09/18/2023)   Received from [facility] - Transportation    In the past 12 months, has lack of transportation kept you from medical appointments or from getting medications?: No    Lack of Transportation (Non-Medical): No  Physical Activity: Insufficiently Active (10/12/2019)   Received from [facility] visits prior to 03/02/2023., [facility] visits prior to 03/02/2023.   Exercise Vital Sign    Days of Exercise per Week: 3 days    Minutes of Exercise per Session: 30 min   Stress: No Stress Concern Present (10/12/2019)   Received from [facility] visits prior to 03/02/2023., [facility] visits prior to 03/02/2023.   [employer] [facility] [employer] - Occupational Stress Questionnaire    Feeling of Stress : Only a little  Social Connections: Unknown (05/14/2022)   Received from [facility]   Social Network    Social Network: Not on file  Intimate Partner Violence: Unknown (04/05/2022)   Received from [facility]   HITS    Physically Hurt: Not on file    Insult or Talk Down To: Not on file    Threaten Physical Harm: Not on file    Scream or Curse: Not on file    Family History  Adopted: Yes  Problem Relation Age of Onset   Heart failure Father        Died at 85   Bipolar disorder Mother    Pneumonia Mother        Died at 21    Past Medical History:  Diagnosis Date   Chronic headaches    Lupus    Neck pain    POTS (postural orthostatic tachycardia syndrome)    Syncope    a. unclear etilogy. 10/2016: cardiac MRI and echo unremarkable.     Patient  Active Problem List   Diagnosis Date Noted   Generalized anxiety disorder 02/11/2024   Panic disorder with agoraphobia 02/11/2024   History of ADHD 02/11/2024   Morbid obesity (HCC) 12/31/2023   Discoloration of skin of lower leg 12/31/2023   Elevated testosterone  level in female 12/05/2023   History of suicide attempt 12/04/2023   History of drug abuse in remission (HCC) 12/04/2023   Amenorrhea 12/04/2023   Bilateral nipple discharge 12/04/2023   Attention deficit 12/04/2023   History of trauma 12/04/2023   Systemic lupus erythematosus (HCC)    PTSD (post-traumatic stress disorder) 06/05/2017    Past Surgical History:  Procedure Laterality Date   EPIDURAL BLOOD PATCH  10/2023   KNEE SURGERY Right 12/31/2009   TONSILLECTOMY Bilateral     Current Outpatient Medications  Medication Sig Dispense Refill    acetaminophen  (TYLENOL ) 500 MG tablet Take 500 mg by mouth every 6 (six) hours as needed for headache.     amphetamine -dextroamphetamine (ADDERALL XR) 10 MG 24 hr capsule Take 1 capsule (10 mg total) by mouth daily. 30 capsule 0   BENLYSTA 200 MG/ML SOAJ Inject 200 mg into the skin once a week.     clonazePAM  (KLONOPIN ) 0.5 MG tablet Take 1 tablet (0.5 mg total) by mouth 2 (two) times daily as needed for anxiety. 20 tablet 0   Cyanocobalamin (B-12 PO) Take by mouth.     cycloSPORINE (RESTASIS) 0.05 % ophthalmic emulsion Apply to eye.     folic acid (FOLVITE) 1 MG tablet Take by mouth.     gabapentin (NEURONTIN) 300 MG capsule Take 1 capsule by mouth at bedtime.     hydroxychloroquine (PLAQUENIL) 200 MG tablet Take 200 mg by mouth 2 (two) times daily.     methotrexate 50 MG/2ML injection Inject into the skin.     norethindrone (AYGESTIN) 5 MG tablet Take 5 mg by mouth daily.     Semaglutide -Weight Management (WEGOVY ) 0.5 MG/0.5ML SOAJ Inject 0.5 mg into the skin once a week. Start after 4 weeks of 0.25 mg 3 mL 1   topiramate  (TOPAMAX ) 100 MG tablet Take 1 tablet (100 mg total) by mouth at bedtime. 90 tablet 3   ulipristal acetate (ELLA) 30 MG tablet Take by mouth.     VENTOLIN HFA 108 (90 Base) MCG/ACT inhaler Inhale 2 puffs into the lungs every 4 (four) hours as needed.     No current facility-administered medications for this visit.    Allergies as of 05/06/2024   (No Known Allergies)    Vitals: There were no vitals taken for this visit. Last Weight:  Wt Readings from Last 1 Encounters:  03/31/24 223 lb 9.6 oz (101.4 kg)   Last Height:   Ht Readings from Last 1 Encounters:  03/31/24 5\' 2"  (1.575 m)     Physical exam: Exam: Gen: NAD, conversant, well nourised, obese, well groomed                     CV: RRR, no MRG. No Carotid Bruits. No peripheral edema, warm, nontender Eyes: Conjunctivae clear without exudates or hemorrhage  Neuro: Detailed Neurologic Exam  Speech:     Speech is normal; fluent and spontaneous with normal comprehension.  Cognition:    The patient is oriented to person, place, and time;     recent and remote memory intact;     language fluent;     normal attention, concentration,     fund of knowledge Cranial Nerves:    The  pupils are equal, round, and reactive to light. Elevated ONHs? Visual fields are full to finger confrontation. Extraocular movements are intact. Trigeminal sensation is intact and the muscles of mastication are normal. The face is symmetric. The palate elevates in the midline. Hearing intact. Voice is normal. Shoulder shrug is normal. The tongue has normal motion without fasciculations.   Coordination: nml  Gait: nml  Motor Observation:    No asymmetry, no atrophy, and no involuntary movements noted. Tone:    Normal muscle tone.    Posture:    Posture is normal. normal erect    Strength:    Strength is V/V in the upper and lower limbs.      Sensation: intact to LT     Reflex Exam:  DTR's:    Deep tendon reflexes in the upper and lower extremities are normal bilaterally.   Toes:    The toes are downgoing bilaterally.   Clonus:    Clonus is absent.    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 Also recent optometrist exam with "ONH edema and enlarged blind spots". She was evaluated extensively at [facility] with opening pressure of only 17. Needs further evaluation:  Send to Dr. Candi Chafe for ophthalmologic evaluation. Most recently Patient Saw optometrist who stated mild ONH edema and VF testing showed enlarged blind spots but [facility] prior stated normal. Would like someone I trust to evaluate her for elevated optic nerves/papilledema/OCT and VF testing. Opening pressure was only 17 but she has all the characteristics of IDIOPATHIC INTRACRANIAL HYPERTENSION and although her disk margins are crisp I may see some ONH elevation on exam. Thank you for taking a  look! Was on plaquenil, now stopped - could this be the cause? 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 and nipple discharge ongoing from breasts; MRA of the head for pulsatile tinnitis Prolactin level, expresses liquid from breasts; Has not been to endocrinology for her pituitary tumor and nipple discharge from breasts will refer Has not had a mammogram for years for her nipple discharge (not pregnant) and does not have a pcp will order and see if I can ask a colleague to see her deficiencies in vitamins B1, B2, B9, and B12 can cause optic nerve head swelling and other visual disturbances, check bloodwork Blood patch for spinal headache: since LP has headache worse with standing, better with laying Patient should be on short-term disability until work up complete due to very concerning symptoms as above Was on plaquenil, now stopped - could this be the cause? See ophthalmology  Discussed:  Other causes of optic nerve head swelling, also known as papilledema, include: High intracranial pressure  Tumors Infections, bleeding, or inflammation in the brain or meninges Cerebral venous sinus thrombosis (CTV negative) Iron-deficiency anemia Medication usage? Plaquenil?  No orders of the defined types were placed in this encounter.  No orders of the defined types were placed in this encounter.  Discussed:   Idiopathic intracranial hypertension (IIH)   Idiopathic Intracranial Hypertension  Idiopathic intracranial hypertension (IIH) is a condition that increases pressure around the brain. The fluid that surrounds the brain and spinal cord (cerebrospinal fluid, or CSF) increases and causes the pressure. Idiopathic means that the cause of this condition is not known. IIH affects the brain and spinal cord. If this condition is not treated, it can cause vision loss or blindness. What  are the causes?  The cause of this condition is not known. What increases the risk? The following factors may make you more likely to develop this condition: Being obese. Being a person who is female, between the ages of 44 and 32 years old, and who has not gone through menopause. Taking certain medicines, such as birth control, acne medicines, or steroids. What are the signs or symptoms? Symptoms of this condition include: Headaches. This is the most common symptom. Brief periods of total blindness. Double vision, blurred vision, or poor side (peripheral) vision. Pain in the shoulders or neck. Nausea and vomiting. A sound like rushing water or a pulsing sound within the ears (pulsatile tinnitus), or ringing in the ears. How is this diagnosed? This condition may be diagnosed based on: Your symptoms and medical history. Imaging tests of the brain, such as: CT scan. MRI. Magnetic resonance venogram (MRV) to check the veins. Diagnostic lumbar puncture. This is a procedure to remove and examine a sample of CSF. This procedure can determine whether your fluid pressure is too high. An eye exam to check for swelling or nerve damage in the eyes. How is this treated? Treatment for this condition depends on the symptoms. The goal of treatment is to decrease the pressure around your brain. Common treatments include: Weight loss through healthy eating, salt restriction, and exercise, if you are overweight. Medicines to decrease the production of CSF and lower the pressure within your skull. Medicines to prevent or treat headaches. Other treatments may include: Surgery to place drains (shunts) in your brain to remove extra fluid. Lumbar puncture to remove extra CSF. Follow these instructions at home: If you are overweight or obese, work with your health care provider to lose weight. Take over-the-counter and prescription medicines only as told by your health care provider. Ask your health care  provider if the medicine prescribed to you requires you to avoid driving or using machinery. Do not use any products that contain nicotine  or tobacco. These products include cigarettes, chewing tobacco, and vaping devices, such as e-cigarettes. If you need help quitting, ask your health care provider. Keep all follow-up visits. Your health care provider will need to monitor you regularly. Contact a health care provider if: You have changes in your vision, such as: Double vision. Blurred vision. Poor peripheral vision. Get help right away if: You have any of the following symptoms and they get worse or do not get better: Headaches. Nausea. Vomiting. Sudden trouble seeing. This information is not intended to replace advice given to you by your health care provider. Make sure you discuss any questions you have with your health care provider. Document Revised: 05/15/2022 Document Reviewed: 04/24/2022 Elsevier Patient Education  2024 Elsevier Inc.  For any acute change especially worsening headache or vision loss call 911 and proceed to ED  To prevent or relieve headaches, try the following: Cool Compress. Lie down and place a cool compress on your head.  Avoid headache triggers. If certain foods or odors seem to have triggered your migraines in the past, avoid them. A headache diary might help you identify triggers.  Include physical activity in your daily routine. Try a daily walk or other moderate aerobic exercise.  Manage stress. Find healthy ways to cope with the stressors, such as delegating tasks on your to-do list.  Practice relaxation techniques. Try deep breathing, yoga, massage and visualization.  Eat regularly. Eating regularly scheduled meals and maintaining a healthy diet might help prevent headaches. Also, drink plenty of fluids.  Follow a  regular sleep schedule. Sleep deprivation might contribute to headaches Consider biofeedback. With this mind-body technique, you learn to  control certain bodily functions -- such as muscle tension, heart rate and blood pressure -- to prevent headaches or reduce headache pain.    Proceed to emergency room if you experience new or worsening symptoms or symptoms do not resolve, if you have new neurologic symptoms or if headache is severe, or for any concerning symptom.   Provided education and documentation from American headache Society toolbox including articles on: pseudotumoer cerebri(IIH), chronic migraine medication overuse headache, chronic migraines, prevention of migraines and other headaches, behavioral and other nonpharmacologic treatments for headache.    No orders of the defined types were placed in this encounter.  No orders of the defined types were placed in this encounter.   Cc: Ginger Lai,  Trenton Frock, PA-C  Aldona Amel, MD  [facility] [address] [place], [place] [address]  Phone [phone] Fax [phone]  I spent 75 minutes of face-to-face and non-face-to-face time with patient on the  No diagnosis found.  diagnosis.  This included previsit chart review, lab review, study review, order entry, electronic health record documentation, patient education on the different diagnostic and therapeutic options, counseling and coordination of care, risks and benefits of management, compliance, or risk factor reduction

## 2024-05-07 ENCOUNTER — Other Ambulatory Visit: Payer: Self-pay | Admitting: Physician Assistant

## 2024-05-07 DIAGNOSIS — R41840 Attention and concentration deficit: Secondary | ICD-10-CM

## 2024-05-07 MED ORDER — AMPHETAMINE-DEXTROAMPHET ER 10 MG PO CP24: 10.0000 mg | ORAL_CAPSULE | Freq: Every day | ORAL | 0 refills | Status: DC

## 2024-05-07 NOTE — Telephone Encounter (Signed)
 Requesting: Adderall XR 10mg   Contract: 01/08/24 UDS:  01/08/24 Last Visit: 03/31/24 Next Visit: None Last Refill: 03/27/24 #30 and 0RF  Please Advise

## 2024-05-20 ENCOUNTER — Ambulatory Visit: Payer: Self-pay

## 2024-05-20 NOTE — Telephone Encounter (Signed)
 Copied from CRM [id number]. Topic: Clinical - Red Word Triage >> May 20, 2024  3:39 PM Kevelyn M wrote: Red Word that prompted transfer to Nurse Triage: Patient is experiencinga lupus flareup (Dizzy, blurry vision, fatigued, lupus rashes, pain in stomach)   Chief Complaint: Lupus flare up  Symptoms: fatigue Frequency: constant Pertinent Negatives: Patient denies shortness Disposition: [] ED /[] Urgent Care (no appt availability in office) / [] Appointment(In office/virtual)/ []  [facility]/ [] Home Care/ [] Refused Recommended Disposition /[] [facility]/ []  Follow-up with PCP Additional Notes: Pt report lupus flare x 2 weeks, seen by rheumatologist on 5/15 and labs have been ordered patient has been advised to f/u with her PCP as well. Appt schedule Reason for Disposition  [1] MODERATE weakness (i.e., interferes with work, school, normal activities) AND [2] persists > 3 days  Answer Assessment - Initial Assessment Questions 1. DESCRIPTION: "Describe how you are feeling."     Lack of energy; fatigue  2. SEVERITY: "How bad is it?"  "Can you stand and walk?"   - MILD (0-3): Feels weak or tired, but does not interfere with work, school or normal activities.   - MODERATE (4-7): Able to stand and walk; weakness interferes with work, school, or normal activities.   - SEVERE (8-10): Unable to stand or walk; unable to do usual activities.     Moderate, extremely   3. ONSET: "When did these symptoms begin?" (e.g., hours, days, weeks, months)     2 weeks, getting worse  4. CAUSE: "What do you think is causing the weakness or fatigue?" (e.g., not drinking enough fluids, medical problem, trouble sleeping)     History oif lupus  5. NEW MEDICINES:  "Have you started on any new medicines recently?" (e.g., opioid pain medicines, benzodiazepines, muscle relaxants, antidepressants, antihistamines, neuroleptics, beta blockers)     Methotrexate, started on March  6. OTHER SYMPTOMS: "Do  you have any other symptoms?" (e.g., chest pain, fever, cough, SOB, vomiting, diarrhea, bleeding, other areas of pain)     Dizziness, blurry vision, lupus rash, abdominal pain  7. PREGNANCY: "Is there any chance you are pregnant?" "When was your last menstrual period?"     LMP, May 2025  Protocols used: Weakness (Generalized) and Fatigue-A-AH

## 2024-05-21 ENCOUNTER — Other Ambulatory Visit: Payer: Self-pay | Admitting: Physician Assistant

## 2024-05-21 ENCOUNTER — Ambulatory Visit: Admitting: Physician Assistant

## 2024-05-21 MED ORDER — WEGOVY 0.5 MG/0.5ML ~~LOC~~ SOAJ: 0.5000 mg | SUBCUTANEOUS | 0 refills | Status: DC

## 2024-05-21 NOTE — Progress Notes (Deleted)
      Established patient visit   Patient: Shannon Galloway   DOB: 11/19/1996   28 y.o. Female  MRN: [id number] Visit Date: 05/21/2024  Today's healthcare provider: Trenton Frock, PA-C   No chief complaint on file.  Subjective     ***  Medications: Outpatient Medications Prior to Visit  Medication Sig   acetaminophen  (TYLENOL ) 500 MG tablet Take 500 mg by mouth every 6 (six) hours as needed for headache.   amphetamine -dextroamphetamine (ADDERALL XR) 10 MG 24 hr capsule Take 1 capsule (10 mg total) by mouth daily.   BENLYSTA 200 MG/ML SOAJ Inject 200 mg into the skin once a week.   clonazePAM  (KLONOPIN ) 0.5 MG tablet Take 1 tablet (0.5 mg total) by mouth 2 (two) times daily as needed for anxiety.   Cyanocobalamin (B-12 PO) Take by mouth.   cycloSPORINE (RESTASIS) 0.05 % ophthalmic emulsion Apply to eye.   folic acid (FOLVITE) 1 MG tablet Take by mouth.   gabapentin (NEURONTIN) 300 MG capsule Take 1 capsule by mouth at bedtime.   hydroxychloroquine (PLAQUENIL) 200 MG tablet Take 200 mg by mouth 2 (two) times daily.   methotrexate 50 MG/2ML injection Inject into the skin.   norethindrone (AYGESTIN) 5 MG tablet Take 5 mg by mouth daily.   Semaglutide -Weight Management (WEGOVY ) 0.5 MG/0.5ML SOAJ Inject 0.5 mg into the skin once a week. Start after 4 weeks of 0.25 mg   topiramate  (TOPAMAX ) 100 MG tablet Take 1 tablet (100 mg total) by mouth at bedtime.   ulipristal acetate (ELLA) 30 MG tablet Take by mouth.   VENTOLIN HFA 108 (90 Base) MCG/ACT inhaler Inhale 2 puffs into the lungs every 4 (four) hours as needed.   No facility-administered medications prior to visit.    Review of Systems {Insert previous labs (optional):23779} {See past labs  Heme  Chem  Endocrine  Serology  Results Review (optional):1}   Objective    There were no vitals taken for this visit. {Insert last BP/Wt (optional):23777}{See vitals history (optional):1}  Physical Exam  ***  No results found  for any visits on 05/21/24.  Assessment & Plan    There are no diagnoses linked to this encounter.  ***  No follow-ups on file.       Trenton Frock, PA-C  [facility] [phone] (phone) [phone] (fax)  [facility]

## 2024-05-21 NOTE — Telephone Encounter (Signed)
 Copied from CRM [id number]. Topic: Clinical - Medication Refill >> May 21, 2024  4:15 PM Magdalene School wrote: Medication: Semaglutide -Weight Management (WEGOVY ) 0.5 MG/0.5ML SOAJ  Has the patient contacted their pharmacy? Yes (Agent: If no, request that the patient contact the pharmacy for the refill. If patient does not wish to contact the pharmacy document the reason why and proceed with request.) (Agent: If yes, when and what did the pharmacy advise?) Prescription expired.  This is the patient's preferred pharmacy:  [facility] - [place], [place] - [address] [place] [address] Phone: [phone] Fax: [phone]  Is this the correct pharmacy for this prescription? Yes If no, delete pharmacy and type the correct one.   Has the prescription been filled recently? No  Is the patient out of the medication? Yes  Has the patient been seen for an appointment in the last year OR does the patient have an upcoming appointment? Yes  Can we respond through MyChart? Yes  Agent: Please be advised that Rx refills may take up to 3 business days. We ask that you follow-up with your pharmacy.

## 2024-05-27 ENCOUNTER — Ambulatory Visit: Payer: Self-pay

## 2024-05-27 ENCOUNTER — Other Ambulatory Visit: Payer: Self-pay | Admitting: Physician Assistant

## 2024-05-27 ENCOUNTER — Ambulatory Visit: Admitting: Physician Assistant

## 2024-05-27 DIAGNOSIS — F4001 Agoraphobia with panic disorder: Secondary | ICD-10-CM

## 2024-05-27 NOTE — Telephone Encounter (Unsigned)
 Copied from CRM [id number]. Topic: Clinical - Medication Refill >> May 27, 2024  3:19 PM [name] wrote: Medication: clonazePAM  (KLONOPIN ) 0.5 MG tablet  Has the patient contacted their pharmacy? Yes (Agent: If no, request that the patient contact the pharmacy for the refill. If patient does not wish to contact the pharmacy document the reason why and proceed with request.) (Agent: If yes, when and what did the pharmacy advise?)  This is the patient's preferred pharmacy:  [facility] - [place], [place] - [address] [place] [address] Phone: [phone] Fax: [phone]  Is this the correct pharmacy for this prescription? Yes If no, delete pharmacy and type the correct one.   Has the prescription been filled recently? Yes  Is the patient out of the medication? Yes  Has the patient been seen for an appointment in the last year OR does the patient have an upcoming appointment? Yes  Can we respond through MyChart? Yes  Agent: Please be advised that Rx refills may take up to 3 business days. We ask that you follow-up with your pharmacy.

## 2024-05-27 NOTE — Telephone Encounter (Signed)
 Chief Complaint: panic attack Symptoms: panic, anxiety, pacing, shaking, vomiting, fast breathing Frequency: worsening anxiety, one panic attack per wk Pertinent Negatives: Patient denies SOB, HI, SI Disposition: [] ED /[] Urgent Care (no appt availability in office) / [x] Appointment(In office/virtual)/ []  [facility]/ [] Home Care/ [] Refused Recommended Disposition /[] [facility]/ []  Follow-up with PCP  Additional Notes: Pt reports having a panic attack. Pt is currently at school in the bathroom. Pt states this is her first day back at school in 2 months. Pt reports she had anxiety when she was a teenager and her parents passed away. Pt states her anxiety improved. Now, pt states her anxiety has returned. Pt reports stress due to her uncle's recent passing as well as her Lupus diagnosis, flare-up, and chemo treatment. Pt states she is currently experiencing shaking, nausea and vomiting, pacing, and a fast heart rate. Pt states she is not anxious every day, but some days she is. Pt states she is still able to perform ADLs but she is having to force herself to do it. Pt states she was taking Klonopin  and that helped but she has since run out.  Pt had an appt today but she had to cancel it because she did not have transportation. Pt was scheduled on Friday instead by a specialist here at [facility]. This RN spoke to the pt after that appt was made. This RN advised the pt that ideally she should be seen within 24 hours. Pt states she has class again tomorrow and does not have a car and will not be able to come into the office until Friday. Since pt is not experiencing SI/HI and the pt's anxiety level came down while on the phone with the RN, RN feels pt can wait until Friday. RN does not feel ED or UC is appropriate at this time.  This RN spoke to the pt on the phone for approx 40 minutes. Pt states she felt much better by the end of the phone call. Pt states her anxiety level has decreased and  states she feels better.  RN and pt discussed when it would be most appropriate for the pt to go to the ED. RN advised pt if she develops SI or HI, severe CP, SOB, or severe impairment/inability to perform ADLs she needs to call 911. Pt verbalized understanding.     Copied from CRM [id number]. Topic: Clinical - Red Word Triage >> May 27, 2024  1:02 PM Albertha Alosa wrote: [facility] that prompted transfer to Nurse Triage: Patient called in regarding Lupus flare; dizziness,, stated she is having panic attacks, and isnt feeling to good. Reason for Disposition  Patient sounds very upset or troubled to the triager  Answer Assessment - Initial Assessment Questions 1. CONCERN: "Did anything happen that prompted you to call today?"      Panic attack  2. ANXIETY SYMPTOMS: "Can you describe how you (your loved one; patient) have been feeling?" (e.g., tense, restless, panicky, anxious, keyed up, overwhelmed, sense of impending doom).      Hiding in the bathroom at school; states she is having a panic attack, shaking, nausea & vomiting, unable to eat 3. ONSET: "How long have you been feeling this way?" (e.g., hours, days, weeks)     1x panic attack/ wk; panic attacks as a child as well, states they have come back  4. SEVERITY: "How would you rate the level of anxiety?" (e.g., 0 - 10; or mild, moderate, severe).     9/10; first  day back on campus from break, starting classes again 5. FUNCTIONAL IMPAIRMENT: "How have these feelings affected your ability to do daily activities?" "Have you had more difficulty than usual doing your normal daily activities?" (e.g., getting better, same, worse; self-care, school, work, interactions)     "I can get up and do stuff, I would say I am having a little bit of trouble, I am always anxious when I am doing stuff"; "some days I don't feel anxiety at all, some days it's like this" 6. HISTORY: "Have you felt this way before?" "Have you ever been diagnosed with an anxiety problem in  the past?" (e.g., generalized anxiety disorder, panic attacks, PTSD). If Yes, ask: "How was this problem treated?" (e.g., medicines, counseling, etc.)     Yes - panic attacks started when both her parents died when she was a teenager; pt states she got better but the anxiety has returned 7. RISK OF HARM - SUICIDAL IDEATION: "Do you ever have thoughts of hurting or killing yourself?" If Yes, ask:  "Do you have these feelings now?" "Do you have a plan on how you would do this?"     "I'm not depressed, I'm just anxious""; denies thoughts of self-harm  8. TREATMENT:  "What has been done so far to treat this anxiety?" (e.g., medicines, relaxation strategies). "What has helped?"     States she has tried breathing, praying, church, [employer] w/ deep breathing; pt states she has tried everything 9. TREATMENT - THERAPIST: "Do you have a counselor or therapist? Name?"     States it is hard to find out that accepts her insurance; states a psychiatrist did not help 10. POTENTIAL TRIGGERS: "Do you drink caffeinated beverages (e.g., coffee, colas, teas), and how much daily?" "Do you drink alcohol or use any drugs?" "Have you started any new medicines recently?"       None; sober from alcohol for 3 yrs - states she ran out of Klonopin  a week ago and hasn't had a panic attack since she ran out; pt states Klonopin  helps her to calm down 11. PATIENT SUPPORT: "Who is with you now?" "Who do you live with?" "Do you have family or friends who you can talk to?"        Alone at school 12. OTHER SYMPTOMS: "Do you have any other symptoms?" (e.g., feeling depressed, trouble concentrating, trouble sleeping, trouble breathing, palpitations or fast heartbeat, chest pain, sweating, nausea, or diarrhea)       Shaking, vomiting, heart beating fast, pacing, fast breathing (denies difficulty breathing). Pt states she has diarrhea d/t lupus medications  Endorses stressors such as recent death of her uncle, death of both her  parents, Lupus, chemo  Pt states she lives alone, most of her family has passed away; states she feels a little better talking through it  Towards end of call pt reported L chest cramping w/ 2/10, states this happens with anxiety attacks  Protocols used: Anxiety and Panic Attack-A-AH

## 2024-05-28 NOTE — Telephone Encounter (Signed)
 Requesting: clonazepam  0.5mg   Contract: 01/08/24 UDS: 01/08/24 Last Visit: 03/31/24 Next Visit: 05/29/24 Last Refill: 04/14/24 #20 and 0RF   Please Advise

## 2024-05-29 ENCOUNTER — Ambulatory Visit: Payer: Self-pay

## 2024-05-29 ENCOUNTER — Encounter: Payer: Self-pay | Admitting: Physician Assistant

## 2024-05-29 ENCOUNTER — Telehealth: Admitting: Physician Assistant

## 2024-05-29 DIAGNOSIS — Z5982 Transportation insecurity: Secondary | ICD-10-CM | POA: Diagnosis not present

## 2024-05-29 DIAGNOSIS — M329 Systemic lupus erythematosus, unspecified: Secondary | ICD-10-CM | POA: Diagnosis not present

## 2024-05-29 DIAGNOSIS — F4310 Post-traumatic stress disorder, unspecified: Secondary | ICD-10-CM

## 2024-05-29 DIAGNOSIS — F4001 Agoraphobia with panic disorder: Secondary | ICD-10-CM

## 2024-05-29 MED ORDER — CLONAZEPAM 0.5 MG PO TABS: 0.5000 mg | ORAL_TABLET | Freq: Two times a day (BID) | ORAL | 0 refills | Status: DC | PRN

## 2024-05-29 MED ORDER — DULOXETINE HCL 30 MG PO CPEP: 30.0000 mg | ORAL_CAPSULE | Freq: Every day | ORAL | 2 refills | Status: DC

## 2024-05-29 NOTE — Telephone Encounter (Signed)
 Patient called in tearful because she does not have transportation to make it to her appointment today. Patient is upset because she has had to cancel multiple times, due to lack of transportation. Per chart and patient, there are multiple things the patient would like to address with her provider. This RN suggested a virtual appointment now to address her more immediate needs (stress, anxiety, etc.) and an in-person visit at a later time when she has transportation. Patient was receptive to this idea. This RN changed visit type for today's appointment to virtual. This RN encouraged patient to follow-up with office after visit today to schedule an in-person visit, if needed. Patient verbalized understanding.   Copied from CRM [id number]. Topic: Clinical - Medical Advice >> May 29, 2024 12:16 PM [place] E wrote: Reason for CRM: Patient is supposed to have an appointment today for dizziness and a lupus flare up and she cannot find transportation to go. Patient in tears as she is stressed out with the situation and does not want to cancel this appointment. Reason for Disposition  General information question, no triage required and triager able to answer question  Protocols used: Information Only Call - No Triage-A-AH

## 2024-05-29 NOTE — Telephone Encounter (Signed)
 FYI

## 2024-05-29 NOTE — Progress Notes (Signed)
 MyChart Video Visit    Virtual Visit via Video Note   This format is felt to be most appropriate for this patient at this time. Physical exam was limited by quality of the video and audio technology used for the visit.   Patient location: home Provider location: [email or web address]  I discussed the limitations of evaluation and management by telemedicine and the availability of in person appointments. The patient expressed understanding and agreed to proceed.  Patient: Shannon Galloway   DOB: [birth year]   28 y.o. Female  MRN: [id number] Visit Date: 05/29/2024  Today's healthcare provider: Trenton Frock, PA-C   Cc. Lupus flare, anxiety, depression, panic attacks  Subjective    HPI   Pt reports she has been in a lupus flare for several weeks, knowing it has been triggered by her ongoing anxiety, panic attacks.  She has reached out to rheumatology, but they are requesting bloodwork before any medical decisions can be made.  She is currently without a car, and cannot get to their office, or any office without a ride.   Today's visit was supposed to be in person but she lost her ride again and had to change to virtual .   Medications: Outpatient Medications Prior to Visit  Medication Sig   acetaminophen  (TYLENOL ) 500 MG tablet Take 500 mg by mouth every 6 (six) hours as needed for headache.   amphetamine -dextroamphetamine (ADDERALL XR) 10 MG 24 hr capsule Take 1 capsule (10 mg total) by mouth daily.   BENLYSTA 200 MG/ML SOAJ Inject 200 mg into the skin once a week.   Cyanocobalamin (B-12 PO) Take by mouth.   cycloSPORINE (RESTASIS) 0.05 % ophthalmic emulsion Apply to eye.   folic acid (FOLVITE) 1 MG tablet Take by mouth.   gabapentin (NEURONTIN) 300 MG capsule Take 1 capsule by mouth at bedtime.   hydroxychloroquine (PLAQUENIL) 200 MG tablet Take 200 mg by mouth 2 (two) times daily.   methotrexate 50 MG/2ML injection Inject into the skin.   norethindrone (AYGESTIN) 5 MG tablet Take 5  mg by mouth daily.   Semaglutide -Weight Management (WEGOVY ) 0.5 MG/0.5ML SOAJ Inject 0.5 mg into the skin once a week.   topiramate  (TOPAMAX ) 100 MG tablet Take 1 tablet (100 mg total) by mouth at bedtime.   ulipristal acetate (ELLA) 30 MG tablet Take by mouth.   VENTOLIN HFA 108 (90 Base) MCG/ACT inhaler Inhale 2 puffs into the lungs every 4 (four) hours as needed.   [DISCONTINUED] clonazePAM  (KLONOPIN ) 0.5 MG tablet Take 1 tablet (0.5 mg total) by mouth 2 (two) times daily as needed for anxiety.   No facility-administered medications prior to visit.    Review of Systems  Constitutional:  Negative for fatigue and fever.  Respiratory:  Negative for cough and shortness of breath.   Cardiovascular:  Negative for chest pain and leg swelling.  Gastrointestinal:  Negative for abdominal pain.  Neurological:  Negative for dizziness and headaches.  Psychiatric/Behavioral:  The patient is nervous/anxious.         Objective    There were no vitals taken for this visit.      Physical Exam Constitutional:      Appearance: Normal appearance. She is not ill-appearing.  Pulmonary:     Effort: Pulmonary effort is normal.  Neurological:     Mental Status: She is oriented to person, place, and time.  Psychiatric:        Attention and Perception: Attention normal.        Mood  and Affect: Mood is anxious. Affect is tearful.        Speech: Speech normal.        Behavior: Behavior normal. Behavior is cooperative.        Thought Content: Thought content normal.        Judgment: Judgment normal.        Assessment & Plan     1. PTSD (post-traumatic stress disorder) (Primary) 2. Panic disorder with agoraphobia Recommending she start cymbalta 30 mg for help w/ lupus related pain, anxiety, panic, depression.  F/b if unable to tolerate. Reviewed SE.  She will cont klonopin  prn for panic attacks. Reviewed pdmp use is appropriate.  She has been d/c from her psychiatrist's office d/t  no-shows.  She has no transportation, limited family support, and is in school full time -- which she needs to be financially to continue to get loans. She is looking for a therapist.   - DULoxetine (CYMBALTA) 30 MG capsule; Take 1 capsule (30 mg total) by mouth daily.  Dispense: 30 capsule; Refill: 2  3. Systemic lupus erythematosus, unspecified SLE type, unspecified organ involvement status (HCC) Will pend labs from rheum - will attempt to set up transportation to at least our office and we can fax to rheum   - DULoxetine (CYMBALTA) 30 MG capsule; Take 1 capsule (30 mg total) by mouth daily.  Dispense: 30 capsule; Refill: 2 - AMB Referral VBCI Care Management - CBC w/Diff; Future - Anti-DNA antibody, double-stranded; Future - C3 complement; Future - C4 complement; Future - Urine Microscopic Only; Future - Urine Microalbumin w/creat. ratio; Future - Comp Met (CMET); Future - Lipase; Future - Sedimentation rate; Future - C-reactive protein; Future - B-HCG Quant; Future - UA/M w/rflx Culture, Routine; Future  4. Transportation insecurity - AMB Referral VBCI Care Management   Return if symptoms worsen or fail to improve.     I discussed the assessment and treatment plan with the patient. The patient was provided an opportunity to ask questions and all were answered. The patient agreed with the plan and demonstrated an understanding of the instructions.   The patient was advised to call back or seek an in-person evaluation if the symptoms worsen or if the condition fails to improve as anticipated.  Trenton Frock, PA-C [facility] [phone] (phone) [phone] (fax)  [facility]

## 2024-06-02 ENCOUNTER — Telehealth: Payer: Self-pay | Admitting: *Deleted

## 2024-06-02 NOTE — Progress Notes (Signed)
 Complex Care Management Note Care Guide Note  06/02/2024 Name: Shannon Galloway MRN: [id number] DOB: [birth year]   Complex Care Management Outreach Attempts: An unsuccessful telephone outreach was attempted today to offer the patient information about available complex care management services.  Follow Up Plan:  Additional outreach attempts will be made to offer the patient complex care management information and services.   Encounter Outcome:  No Answer  [place] [facility] HealthPopulation Health Care Guide  Direct Dial:[phone] Fax:[phone] Website: [email or web address]

## 2024-06-04 ENCOUNTER — Telehealth: Payer: Self-pay | Admitting: *Deleted

## 2024-06-04 NOTE — Progress Notes (Signed)
 careComplex Care Management Note Care Guide Note  06/04/2024 Name: Shannon Galloway MRN: [id number] DOB: Oct 30, 1996   Complex Care Management Outreach Attempts: A second unsuccessful outreach was attempted today to offer the patient with information about available complex care management services.  Follow Up Plan:  Additional outreach attempts will be made to offer the patient complex care management information and services.   Encounter Outcome:  No Answer  [place] [facility] HealthPopulation Health Care Guide  Direct Dial:[phone] Fax:[phone] Website: [email or web address]

## 2024-06-05 ENCOUNTER — Telehealth: Payer: Self-pay | Admitting: *Deleted

## 2024-06-05 NOTE — Progress Notes (Signed)
 Complex Care Management Note Care Guide Note  06/05/2024 Name: EMILIA KAYES MRN: [id number] DOB: May 11, 1996   Complex Care Management Outreach Attempts: A third unsuccessful outreach was attempted today to offer the patient with information about available complex care management services.  Follow Up Plan:  Additional outreach attempts will be made to offer the patient complex care management information and services.   Encounter Outcome:  No Answer  [place] [facility]Population Health Care Guide  Direct [phone] Fax:[phone] Website: [email or web address]

## 2024-06-11 ENCOUNTER — Telehealth: Payer: Self-pay | Admitting: *Deleted

## 2024-06-11 NOTE — Progress Notes (Signed)
 Complex Care Management Note Care Guide Note  06/11/2024 Name: Shannon Galloway MRN: [id number] DOB: 1996/10/20  Shannon Galloway is a 28 y.o. year old female who is a primary care patient of Trenton Frock, [place] . The community resource team was consulted for assistance with Transportation Needs   SDOH screenings and interventions completed:  No    Worked with careguide to place mychart message for patient     Care guide performed the following interventions: Patient provided with information about care guide support team and interviewed to confirm resource needs.  Follow Up Plan:  No further follow up planned at this time. The patient has been provided with needed resources.  Encounter Outcome:  Patient Visit Completed [name]  [facility] HealthPopulation Health Care Guide  Direct Dial:[phone] Fax:[phone] Website: [email or web address]

## 2024-06-15 ENCOUNTER — Other Ambulatory Visit: Payer: Self-pay | Admitting: Physician Assistant

## 2024-06-15 MED ORDER — WEGOVY 0.5 MG/0.5ML ~~LOC~~ SOAJ: 0.5000 mg | SUBCUTANEOUS | 1 refills | Status: DC

## 2024-06-15 NOTE — Telephone Encounter (Signed)
 Copied from CRM [id number]. Topic: Clinical - Medication Refill >> Jun 15, 2024 12:46 PM Baldo Levan wrote: Medication:  Wegovy  Semaglutide -Weight Management (WEGOVY ) 0.5 MG/0.5ML SOAJ   Has the patient contacted their pharmacy? Yes (Agent: If no, request that the patient contact the pharmacy for the refill. If patient does not wish to contact the pharmacy document the reason why and proceed with request.) (Agent: If yes, when and what did the pharmacy advise?)  This is the patient's preferred pharmacy:  [facility] - [place], [place] - [address] [place] [address] Phone: [phone] Fax: [phone]  Is this the correct pharmacy for this prescription? Yes If no, delete pharmacy and type the correct one.   Has the prescription been filled recently? No  Is the patient out of the medication? Yes  Has the patient been seen for an appointment in the last year OR does the patient have an upcoming appointment? Yes  Can we respond through MyChart? Yes  Agent: Please be advised that Rx refills may take up to 3 business days. We ask that you follow-up with your pharmacy.

## 2024-06-25 ENCOUNTER — Emergency Department (HOSPITAL_COMMUNITY)
Admission: EM | Admit: 2024-06-25 | Discharge: 2024-06-25 | Disposition: A | Discharge status: Home / Self Care | Attending: Emergency Medicine | Admitting: Emergency Medicine

## 2024-06-25 ENCOUNTER — Emergency Department (HOSPITAL_COMMUNITY)

## 2024-06-25 ENCOUNTER — Encounter (HOSPITAL_COMMUNITY): Payer: Self-pay

## 2024-06-25 ENCOUNTER — Other Ambulatory Visit: Payer: Self-pay

## 2024-06-25 DIAGNOSIS — R112 Nausea with vomiting, unspecified: Secondary | ICD-10-CM | POA: Insufficient documentation

## 2024-06-25 DIAGNOSIS — M545 Low back pain, unspecified: Secondary | ICD-10-CM | POA: Insufficient documentation

## 2024-06-25 LAB — BASIC METABOLIC PANEL WITH GFR
Anion gap: 9 (ref 5–15)
BUN: 10 mg/dL (ref 6–20)
CO2: 19 mmol/L — ABNORMAL LOW (ref 22–32)
Calcium: 9.1 mg/dL (ref 8.9–10.3)
Chloride: 109 mmol/L (ref 98–111)
Creatinine, Ser: 0.89 mg/dL (ref 0.44–1.00)
GFR, Estimated: >60 mL/min (ref >60)
Glucose, Bld: 89 mg/dL (ref 70–99)
Potassium: 3.9 mmol/L (ref 3.5–5.1)
Sodium: 137 mmol/L (ref 135–145)

## 2024-06-25 LAB — CBC WITH DIFFERENTIAL/PLATELET
Abs Immature Granulocytes: 0.01 10*3/uL (ref 0.00–0.07)
Basophils Absolute: 0.1 10*3/uL (ref 0.0–0.1)
Basophils Relative: 1 %
Eosinophils Absolute: 0.2 10*3/uL (ref 0.0–0.5)
Eosinophils Relative: 3 %
HCT: 40.3 % (ref 36.0–46.0)
Hemoglobin: 13.5 g/dL (ref 12.0–15.0)
Immature Granulocytes: 0 %
Lymphocytes Relative: 25 %
Lymphs Abs: 2 10*3/uL (ref 0.7–4.0)
MCH: 29.9 pg (ref 26.0–34.0)
MCHC: 33.5 g/dL (ref 30.0–36.0)
MCV: 89.4 fL (ref 80.0–100.0)
Monocytes Absolute: 0.6 10*3/uL (ref 0.1–1.0)
Monocytes Relative: 8 %
Neutro Abs: 4.9 10*3/uL (ref 1.7–7.7)
Neutrophils Relative %: 63 %
Platelets: 296 10*3/uL (ref 150–400)
RBC: 4.51 MIL/uL (ref 3.87–5.11)
RDW: 13.4 % (ref 11.5–15.5)
WBC: 7.8 10*3/uL (ref 4.0–10.5)
nRBC: 0 % (ref 0.0–0.2)

## 2024-06-25 LAB — URINALYSIS, ROUTINE W REFLEX MICROSCOPIC
Bilirubin Urine: NEGATIVE
Glucose, UA: NEGATIVE mg/dL
Hgb urine dipstick: NEGATIVE
Ketones, ur: NEGATIVE mg/dL
Leukocytes,Ua: NEGATIVE
Nitrite: NEGATIVE
Protein, ur: NEGATIVE mg/dL
Specific Gravity, Urine: 1.017 (ref 1.005–1.030)
pH: 5 (ref 5.0–8.0)

## 2024-06-25 LAB — PREGNANCY, URINE: Preg Test, Ur: NEGATIVE

## 2024-06-25 MED ORDER — ONDANSETRON HCL 4 MG/2ML IJ SOLN
4.0000 mg | Freq: Once | INTRAMUSCULAR | Status: AC
  Administered 2024-06-25: 4 mg via INTRAVENOUS
  Filled 2024-06-25: qty 2

## 2024-06-25 MED ORDER — HYDROMORPHONE HCL 1 MG/ML IJ SOLN
1.0000 mg | Freq: Once | INTRAMUSCULAR | Status: AC
  Administered 2024-06-25: 1 mg via INTRAVENOUS
  Filled 2024-06-25: qty 1

## 2024-06-25 MED ORDER — OXYCODONE-ACETAMINOPHEN 5-325 MG PO TABS
1.0000 | ORAL_TABLET | Freq: Once | ORAL | Status: AC
  Administered 2024-06-25: 1 via ORAL
  Filled 2024-06-25: qty 1

## 2024-06-25 MED ORDER — ONDANSETRON 4 MG PO TBDP: 4.0000 mg | ORAL_TABLET | Freq: Three times a day (TID) | ORAL | 0 refills | Status: AC | PRN

## 2024-06-25 MED ORDER — OXYCODONE-ACETAMINOPHEN 5-325 MG PO TABS: 1.0000 | ORAL_TABLET | Freq: Four times a day (QID) | ORAL | 0 refills | Status: DC | PRN

## 2024-06-25 NOTE — Discharge Instructions (Signed)
 You are seen in the emergency department for worsening low back pain after a fall.  You had CAT scans of your lower spine and abdomen along with lab work and urinalysis that did not show an obvious explanation for your symptoms.  This is likely muscular.  Continue ibuprofen .  We are prescribing you a short course of nausea medication and narcotic pain medication.  Follow-up with your treatment team.  Return if any high fevers or worsening symptoms.

## 2024-06-25 NOTE — ED Notes (Signed)
Pt teaching provided on medications that may cause drowsiness. Pt instructed not to drive or operate heavy machinery while taking the prescribed medication. Pt verbalized understanding.   Pt provided discharge instructions and prescription information. Pt was given the opportunity to ask questions and questions were answered.   

## 2024-06-25 NOTE — ED Triage Notes (Signed)
 Pt arrived via POV c/o severe lower back pain and reports she fell Sunday night which Pt reports triggered her pain. Pt reports going to [facility] on Monday and was discharged. Pt reports previous problems from a spinal tap. Pt endorses nausea from the pain. Pt reports pain has has worsened since being discharged from [facility].

## 2024-06-25 NOTE — ED Notes (Signed)
 Verbal order for Zofran  placed per [place], MD

## 2024-06-25 NOTE — ED Provider Notes (Signed)
 [place] EMERGENCY DEPARTMENT AT [facility] Provider Note   CSN: [id number] Arrival date & time: 06/25/24  1027     Patient presents with: Back Pain   Shannon Galloway is a 28 y.o. female.  She has been senting complaining of severe low back pain nausea and vomiting since a minor fall 4 days ago.  She has a history of lupus.  Said went to [place] and they did x-rays.  She is afraid it is related to a spinal tap and blood patch that she got back in September.  Pain in the same area.  No numbness or weakness no fevers.  Not on any blood thinners.   The history is provided by the patient.  Back Pain Location:  Lumbar spine Quality:  Aching Pain severity:  Severe Pain is:  Same all the time Onset quality:  Gradual Duration:  4 days Timing:  Constant Progression:  Worsening Chronicity:  New Context: falling   Relieved by:  Nothing Worsened by:  Ambulation and bending Ineffective treatments:  NSAIDs Associated symptoms: no abdominal pain, no bladder incontinence, no bowel incontinence, no dysuria, no fever, no numbness and no weakness        Prior to Admission medications   Medication Sig Start Date End Date Taking? Authorizing Provider  acetaminophen  (TYLENOL ) 500 MG tablet Take 500 mg by mouth every 6 (six) hours as needed for headache.    [provider]  amphetamine -dextroamphetamine (ADDERALL XR) 10 MG 24 hr capsule Take 1 capsule (10 mg total) by mouth daily. 05/07/24   Drubel, Manuelita, PA-C  BENLYSTA 200 MG/ML SOAJ Inject 200 mg into the skin once a week. 05/16/23   [provider]  clonazePAM  (KLONOPIN ) 0.5 MG tablet Take 1 tablet (0.5 mg total) by mouth 2 (two) times daily as needed for anxiety. 05/29/24   Cyndi Manuelita, PA-C  Cyanocobalamin (B-12 PO) Take by mouth.    [provider]  cycloSPORINE (RESTASIS) 0.05 % ophthalmic emulsion Apply to eye. 09/06/23   [provider]  DULoxetine  (CYMBALTA ) 30 MG capsule Take 1  capsule (30 mg total) by mouth daily. 05/29/24   Cyndi Manuelita, PA-C  folic acid (FOLVITE) 1 MG tablet Take by mouth. 03/04/24 03/27/25  [provider]  gabapentin (NEURONTIN) 300 MG capsule Take 1 capsule by mouth at bedtime. 10/26/23 10/25/24  [provider]  hydroxychloroquine (PLAQUENIL) 200 MG tablet Take 200 mg by mouth 2 (two) times daily. 04/19/21   [provider]  methotrexate 50 MG/2ML injection Inject into the skin. 03/04/24   [provider]  norethindrone (AYGESTIN) 5 MG tablet Take 5 mg by mouth daily. 03/04/24 03/04/25  [provider]  Semaglutide -Weight Management (WEGOVY ) 0.5 MG/0.5ML SOAJ Inject 0.5 mg into the skin once a week. 06/15/24   Cyndi Manuelita, PA-C  topiramate  (TOPAMAX ) 100 MG tablet Take 1 tablet (100 mg total) by mouth at bedtime. 10/23/23   Ines Onetha NOVAK, MD  ulipristal acetate (ELLA) 30 MG tablet Take by mouth. 05/16/23   [provider]  VENTOLIN HFA 108 (90 Base) MCG/ACT inhaler Inhale 2 puffs into the lungs every 4 (four) hours as needed. 09/04/23 09/03/24  [provider]    Allergies: Patient has no known allergies.    Review of Systems  Constitutional:  Negative for fever.  Gastrointestinal:  Negative for abdominal pain and bowel incontinence.  Genitourinary:  Negative for bladder incontinence and dysuria.  Musculoskeletal:  Positive for back pain.  Neurological:  Negative for weakness  and numbness.    Updated Vital Signs BP 119/88 (BP Location: Right Arm)   Pulse 100   Temp 98.2 F (36.8 C) (Oral)   Resp 18   Ht 5' 2 (1.575 m)   Wt 101.4 kg   LMP 06/18/2024 (Exact Date)   SpO2 99%   BMI 40.89 kg/m   Physical Exam Vitals and nursing note reviewed.  Constitutional:      General: She is not in acute distress.    Appearance: Normal appearance. She is well-developed.  HENT:     Head: Normocephalic and atraumatic.   Eyes:     Conjunctiva/sclera: Conjunctivae normal.     Cardiovascular:     Rate and Rhythm: Regular rhythm. Tachycardia present.     Heart sounds: No murmur heard. Pulmonary:     Effort: Pulmonary effort is normal. No respiratory distress.     Breath sounds: Normal breath sounds. No stridor. No wheezing.  Abdominal:     Palpations: Abdomen is soft.     Tenderness: There is no abdominal tenderness. There is no guarding or rebound.   Musculoskeletal:        General: Tenderness present. No deformity. Normal range of motion.     Cervical back: Neck supple.     Comments: Diffuse tenderness lower back paralumbar area.  No overlying erythema.   Skin:    General: Skin is warm and dry.   Neurological:     General: No focal deficit present.     Mental Status: She is alert.     GCS: GCS eye subscore is 4. GCS verbal subscore is 5. GCS motor subscore is 6.     Sensory: No sensory deficit.     Motor: No weakness.     (all labs ordered are listed, but only abnormal results are displayed) Labs Reviewed  BASIC METABOLIC PANEL WITH GFR - Abnormal; Notable for the following components:      Result Value   CO2 19 (*)    All other components within normal limits  CBC WITH DIFFERENTIAL/PLATELET  URINALYSIS, ROUTINE W REFLEX MICROSCOPIC  PREGNANCY, URINE    EKG: None  Radiology: CT Renal Stone Study Result Date: 06/25/2024 CLINICAL DATA:  Abdominal/flank pain. EXAM: CT ABDOMEN AND PELVIS WITHOUT CONTRAST TECHNIQUE: Multidetector CT imaging of the abdomen and pelvis was performed following the standard protocol without IV contrast. RADIATION DOSE REDUCTION: This exam was performed according to the departmental dose-optimization program which includes automated exposure control, adjustment of the mA and/or kV according to patient size and/or use of iterative reconstruction technique. COMPARISON:  Lumbar spine CT dated 06/25/2024. FINDINGS: Evaluation of this exam is limited in the absence of intravenous contrast. Lower chest: The visualized lung  bases are clear. No intra-abdominal free air or free fluid. Hepatobiliary: No focal liver abnormality is seen. No gallstones, gallbladder wall thickening, or biliary dilatation. Pancreas: Unremarkable. No pancreatic ductal dilatation or surrounding inflammatory changes. Spleen: Normal in size without focal abnormality. Adrenals/Urinary Tract: The adrenal glands unremarkable. The kidneys, visualized ureters, and urinary bladder appear unremarkable. Stomach/Bowel: There is no bowel obstruction or active inflammation. The appendix is normal. Vascular/Lymphatic: The abdominal aorta and IVC unremarkable on this noncontrast CT. No portal venous gas. There is no adenopathy. Reproductive: The uterus is anteverted. No suspicious adnexal masses. Other: None Musculoskeletal: No acute or significant osseous findings. IMPRESSION: No acute intra-abdominal or pelvic pathology. No hydronephrosis or nephrolithiasis. Electronically Signed   By: Vanetta Chou M.D.   On: 06/25/2024 13:30   CT L-SPINE  NO CHARGE Result Date: 06/25/2024 CLINICAL DATA:  Recent fall.  Low back pain. EXAM: CT LUMBAR SPINE WITHOUT CONTRAST TECHNIQUE: Multidetector CT imaging of the lumbar spine was performed without intravenous contrast administration. Multiplanar CT image reconstructions were also generated. RADIATION DOSE REDUCTION: This exam was performed according to the departmental dose-optimization program which includes automated exposure control, adjustment of the mA and/or kV according to patient size and/or use of iterative reconstruction technique. COMPARISON:  CT abdomen pelvis dated 05/11/2022. FINDINGS: Segmentation: 5 lumbar type vertebrae. Alignment: Normal. Vertebrae: No acute fracture or focal pathologic process. Paraspinal and other soft tissues: Negative. Disc levels: No acute findings.  No degenerative changes. IMPRESSION: No acute/traumatic lumbar spine pathology. Electronically Signed   By: Vanetta Chou M.D.   On: 06/25/2024  13:27     Procedures   Medications Ordered in the ED  ondansetron  (ZOFRAN ) injection 4 mg (4 mg Intravenous Given 06/25/24 1111)  HYDROmorphone  (DILAUDID ) injection 1 mg (1 mg Intravenous Given 06/25/24 1116)  HYDROmorphone  (DILAUDID ) injection 1 mg (1 mg Intravenous Given 06/25/24 1339)  ondansetron  (ZOFRAN ) injection 4 mg (4 mg Intravenous Given 06/25/24 1344)  oxyCODONE -acetaminophen  (PERCOCET/ROXICET) 5-325 MG per tablet 1 tablet (1 tablet Oral Given 06/25/24 1416)    Clinical Course as of 06/25/24 1717  Thu Jun 25, 2024  1405 Reviewed results of workup with her.  Lab work and imaging negative.  Pain is better controlled.  Will prescribe short course of pain medicine and recommended she follow-up with her care team.  Return instructions discussed [MB]    Clinical Course User Index [MB] Towana Ozell BROCKS, MD                                 Medical Decision Making Amount and/or Complexity of Data Reviewed Labs: ordered. Radiology: ordered.  Risk Prescription drug management.   This patient complains of low back pain nausea vomiting; this involves an extensive number of treatment Options and is a complaint that carries with it a high risk of complications and morbidity. The differential includes biliary colic, renal colic, musculoskeletal, spinal injury, fracture  I ordered, reviewed and interpreted labs, which included CBC normal chemistries normal other than mildly low bicarb urinalysis without signs of infection or hematuria, pregnancy negative I ordered medication IV and oral pain medicine and nausea medicine and reviewed PMP when indicated. I ordered imaging studies which included CT lumbar, CT renal stone study and I independently    visualized and interpreted imaging which showed no acute findings Previous records obtained and reviewed in epic including outpatient rheumatology and PCP notes Cardiac monitoring reviewed, normal sinus rhythm Social determinants considered,  no significant barriers Critical Interventions: None  After the interventions stated above, I reevaluated the patient and found patient's pain to be improved and neuro intact Admission and further testing considered, no indications for admission or further workup at this time.  Will treat symptomatically and recommended close follow-up with her treatment team.  Return instructions discussed      Final diagnoses:  Acute bilateral low back pain without sciatica  Nausea and vomiting, unspecified vomiting type    ED Discharge Orders          Ordered    oxyCODONE -acetaminophen  (PERCOCET/ROXICET) 5-325 MG tablet  Every 6 hours PRN        06/25/24 1407    ondansetron  (ZOFRAN -ODT) 4 MG disintegrating tablet  Every 8 hours PRN  06/25/24 1407               Towana Ozell BROCKS, MD 06/25/24 [phone]

## 2024-06-25 NOTE — ED Triage Notes (Signed)
 Pt reports she receives Chemo every Friday and goes to [facility] for treatment of her Lupus.

## 2024-07-06 ENCOUNTER — Other Ambulatory Visit: Payer: Self-pay | Admitting: Physician Assistant

## 2024-07-06 DIAGNOSIS — R41840 Attention and concentration deficit: Secondary | ICD-10-CM

## 2024-07-06 NOTE — Telephone Encounter (Signed)
 Copied from CRM [id number]. Topic: Clinical - Medication Refill >> Jul 06, 2024  2:45 PM [name] C wrote: Medication:amphetamine -dextroamphetamine (ADDERALL XR) 10 MG 24 hr capsule   Has the patient contacted their pharmacy? Patient tried to fill it through Mychart and it didn't go through.  (Agent: If no, request that the patient contact the pharmacy for the refill. If patient does not wish to contact the pharmacy document the reason why and proceed with request.) (Agent: If yes, when and what did the pharmacy advise?)  This is the patient's preferred pharmacy:  [id number] - [place], [place] - [address] [place] [address] Phone: [phone] Fax: [phone]  Is this the correct pharmacy for this prescription? Yes If no, delete pharmacy and type the correct one.   Has the prescription been filled recently? Yes  Is the patient out of the medication? Patient is out completely.   Has the patient been seen for an appointment in the last year OR does the patient have an upcoming appointment? Yes  Can we respond through MyChart? Yes  Agent: Please be advised that Rx refills may take up to 3 business days. We ask that you follow-up with your pharmacy.

## 2024-07-06 NOTE — Telephone Encounter (Signed)
 Requesting: Adderall XR 10mg   Contract:01/08/24 UDS: 01/08/24 Last Visit: 05/29/24 Next Visit: 07/07/24 Last Refill: 05/07/24 #30 and 0RF   Please Advise

## 2024-07-07 ENCOUNTER — Other Ambulatory Visit (HOSPITAL_COMMUNITY): Payer: Self-pay

## 2024-07-07 ENCOUNTER — Encounter: Payer: Self-pay | Admitting: Physician Assistant

## 2024-07-07 ENCOUNTER — Other Ambulatory Visit

## 2024-07-07 ENCOUNTER — Telehealth: Payer: Self-pay

## 2024-07-07 ENCOUNTER — Ambulatory Visit: Admitting: Physician Assistant

## 2024-07-07 VITALS — BP 121/84 | HR 104 | Ht 62.0 in | Wt 207.4 lb

## 2024-07-07 DIAGNOSIS — F4310 Post-traumatic stress disorder, unspecified: Secondary | ICD-10-CM

## 2024-07-07 DIAGNOSIS — F411 Generalized anxiety disorder: Secondary | ICD-10-CM

## 2024-07-07 DIAGNOSIS — F4001 Agoraphobia with panic disorder: Secondary | ICD-10-CM

## 2024-07-07 DIAGNOSIS — M329 Systemic lupus erythematosus, unspecified: Secondary | ICD-10-CM | POA: Diagnosis not present

## 2024-07-07 MED ORDER — FLUOXETINE HCL 10 MG PO TABS: 10.0000 mg | ORAL_TABLET | Freq: Every day | ORAL | 3 refills | Status: DC

## 2024-07-07 MED ORDER — FLUOXETINE HCL 10 MG PO CAPS: 10.0000 mg | ORAL_CAPSULE | Freq: Every day | ORAL | 3 refills | Status: AC

## 2024-07-07 MED ORDER — AMPHETAMINE-DEXTROAMPHET ER 10 MG PO CP24: 10.0000 mg | ORAL_CAPSULE | Freq: Every day | ORAL | 0 refills | Status: DC

## 2024-07-07 NOTE — Progress Notes (Signed)
 Established patient visit   Patient: Shannon Galloway   DOB: 1996/09/06   28 y.o. Female  MRN: [id number] Visit Date: 07/07/2024  Today's healthcare provider: Manuelita Flatness, PA-C   Cc. Anxiety f/u  Subjective     Anxiety, Follow-up  Pt presents mid panic attack.   Pt reports worsening symptoms of anxiety/panic. She tried the cymbalta  but reported it made her angry so she stopped.  Her anxiety/panic is keeping her from ADLs, socializing, church.   She also reports new family/life stress that we discuss in detail.    We had planned with her rheumatologist that when she was able to come into the office, we would run the rheum labs as it is easier to get here than [facility] rheum. Will forward results.  Medications: Outpatient Medications Prior to Visit  Medication Sig   acetaminophen  (TYLENOL ) 500 MG tablet Take 1,500 mg by mouth every 6 (six) hours as needed for headache.   amphetamine -dextroamphetamine (ADDERALL XR) 10 MG 24 hr capsule Take 1 capsule (10 mg total) by mouth daily.   BENLYSTA 200 MG/ML SOAJ Inject 200 mg into the skin once a week.   Cyanocobalamin (B-12 PO) Take by mouth. (Patient not taking: Reported on 06/25/2024)   cycloSPORINE (RESTASIS) 0.05 % ophthalmic emulsion Place 1 drop into both eyes as needed (dry eyes).   folic acid (FOLVITE) 1 MG tablet Take 2 mg by mouth daily.   gabapentin (NEURONTIN) 300 MG capsule Take 1 capsule by mouth at bedtime.   hydroxychloroquine (PLAQUENIL) 200 MG tablet Take 400 mg by mouth daily.   melatonin 3 MG TABS tablet Take 6 mg by mouth at bedtime.   methotrexate 50 MG/2ML injection Inject 6 mLs into the skin once a week.   norethindrone (AYGESTIN) 5 MG tablet Take 5 mg by mouth daily.   ondansetron  (ZOFRAN -ODT) 4 MG disintegrating tablet Take 1 tablet (4 mg total) by mouth every 8 (eight) hours as needed for vomiting or nausea.   Semaglutide -Weight Management (WEGOVY ) 0.5 MG/0.5ML SOAJ Inject 0.5 mg into the skin once a  week.   topiramate  (TOPAMAX ) 100 MG tablet Take 1 tablet (100 mg total) by mouth at bedtime.   ulipristal acetate (ELLA) 30 MG tablet Take by mouth.   VENTOLIN HFA 108 (90 Base) MCG/ACT inhaler Inhale 2 puffs into the lungs every 4 (four) hours as needed.   [DISCONTINUED] clonazePAM  (KLONOPIN ) 0.5 MG tablet Take 1 tablet (0.5 mg total) by mouth 2 (two) times daily as needed for anxiety.   [DISCONTINUED] DULoxetine  (CYMBALTA ) 30 MG capsule Take 1 capsule (30 mg total) by mouth daily.   [DISCONTINUED] oxyCODONE -acetaminophen  (PERCOCET/ROXICET) 5-325 MG tablet Take 1 tablet by mouth every 6 (six) hours as needed for severe pain (pain score 7-10).   No facility-administered medications prior to visit.    Review of Systems  Constitutional:  Negative for fatigue and fever.  Respiratory:  Negative for cough and shortness of breath.   Cardiovascular:  Negative for chest pain and leg swelling.  Gastrointestinal:  Negative for abdominal pain.  Neurological:  Negative for dizziness and headaches.  Psychiatric/Behavioral:  The patient is nervous/anxious.        Objective    BP 121/84   Pulse (!) 104   Ht 5' 2 (1.575 m)   Wt 207 lb 6.4 oz (94.1 kg)   LMP 06/18/2024 (Exact Date)   BMI 37.93 kg/m    Physical Exam Vitals reviewed.  Constitutional:      Appearance: She  is not ill-appearing.  HENT:     Head: Normocephalic.  Eyes:     Conjunctiva/sclera: Conjunctivae normal.  Cardiovascular:     Rate and Rhythm: Normal rate.  Pulmonary:     Effort: Pulmonary effort is normal. No respiratory distress.  Neurological:     Mental Status: She is alert and oriented to person, place, and time.  Psychiatric:        Attention and Perception: Attention and perception normal.        Mood and Affect: Mood is anxious and depressed. Affect is tearful.        Speech: Speech normal.        Behavior: Behavior normal. Behavior is cooperative.        Thought Content: Thought content normal.         Cognition and Memory: Cognition normal.        Judgment: Judgment normal.      Results for orders placed or performed in visit on 07/07/24  CBC w/Diff  Result Value Ref Range   WBC 8.5 4.0 - 10.5 K/uL   RBC 4.47 3.87 - 5.11 Mil/uL   Hemoglobin 13.4 12.0 - 15.0 g/dL   HCT 60.5 63.9 - 53.9 %   MCV 88.1 78.0 - 100.0 fl   MCHC 34.1 30.0 - 36.0 g/dL   RDW 86.0 88.4 - 84.4 %   Platelets 276.0 150.0 - 400.0 K/uL   Neutrophils Relative % 67.7 43.0 - 77.0 %   Lymphocytes Relative 25.2 12.0 - 46.0 %   Monocytes Relative 5.0 3.0 - 12.0 %   Eosinophils Relative 1.3 0.0 - 5.0 %   Basophils Relative 0.8 0.0 - 3.0 %   Neutro Abs 5.7 1.4 - 7.7 K/uL   Lymphs Abs 2.1 0.7 - 4.0 K/uL   Monocytes Absolute 0.4 0.1 - 1.0 K/uL   Eosinophils Absolute 0.1 0.0 - 0.7 K/uL   Basophils Absolute 0.1 0.0 - 0.1 K/uL  Anti-DNA antibody, double-stranded  Result Value Ref Range   ds DNA Ab 14 (H) IU/mL  C3 complement  Result Value Ref Range   C3 Complement 175 83 - 193 mg/dL  C4 complement  Result Value Ref Range   C4 Complement 22 15 - 57 mg/dL  Urine Microscopic Only  Result Value Ref Range   WBC, UA 0-2/hpf 0-2/hpf   RBC / HPF none seen 0-2/hpf   Squamous Epithelial / HPF Rare(0-4/hpf) Rare(0-4/hpf)   Amorphous Present (A) None;Present  Urine Microalbumin w/creat. ratio  Result Value Ref Range   Microalb, Ur 2.1 (H) 0.0 - 1.9 mg/dL   Creatinine,U 623.7 mg/dL   Microalb Creat Ratio 5.5 0.0 - 30.0 mg/g  Comp Met (CMET)  Result Value Ref Range   Sodium 136 135 - 145 mEq/L   Potassium 3.8 3.5 - 5.1 mEq/L   Chloride 107 96 - 112 mEq/L   CO2 20 19 - 32 mEq/L   Glucose, Bld 90 70 - 99 mg/dL   BUN 12 6 - 23 mg/dL   Creatinine, Ser 9.18 0.40 - 1.20 mg/dL   Total Bilirubin 0.5 0.2 - 1.2 mg/dL   Alkaline Phosphatase 51 39 - 117 U/L   AST 19 0 - 37 U/L   ALT 24 0 - 35 U/L   Total Protein 7.9 6.0 - 8.3 g/dL   Albumin 4.7 3.5 - 5.2 g/dL   GFR 01.32 >39.99 mL/min   Calcium 9.3 8.4 - 10.5 mg/dL  Lipase   Result Value Ref Range   Lipase 23.0  11.0 - 59.0 U/L  Sedimentation rate  Result Value Ref Range   Sed Rate 16 0 - 20 mm/hr  C-reactive protein  Result Value Ref Range   CRP <1.0 0.5 - 20.0 mg/dL  B-HCG Quant  Result Value Ref Range   Quantitative HCG <0.60 mIU/ml    Assessment & Plan    PTSD (post-traumatic stress disorder) Generalized anxiety disorder Panic disorder with agoraphobia Able to stop panic attack today. Discussion on recent family developments, empathized, redirected patient to consider herself and her health before others.  Referring to therapy, stressed importance with moving forward.  Pt has tried several different anxiolytics, antidepressants, but most in childhood she does not recall.  Recent failure of cymbalta  ( chosen to help additionally with SLE pain). Rx prozac  10 mg today. Cont klonopin  prn for pain attacks. She was following with psychiatry who was managing all meds-- but she was d/c d/t frequent no-shows. This is unfortunate for patient as she does need psychiatric care.  -     Ambulatory referral to Psychology -     FLUoxetine  HCl; Take 1 capsule (10 mg total) by mouth daily.  Dispense: 90 capsule; Refill: 3  Systemic lupus erythematosus, unspecified SLE type, unspecified organ involvement status (HCC) -     CBC with Differential/Platelet -     Anti-DNA antibody, double-stranded -     C3 complement -     C4 complement -     Urine Microscopic -     Microalbumin / creatinine urine ratio -     Comprehensive metabolic panel with GFR -     Lipase -     Sedimentation rate -     C-reactive protein -     hCG, quantitative, pregnancy   Return in about 4 weeks (around 08/04/2024) for anxiety.       Manuelita Flatness, PA-C  [facility] [phone] (phone) [phone] (fax)  [facility]

## 2024-07-07 NOTE — Telephone Encounter (Signed)
 Pharmacy Patient Advocate Encounter   Received notification from CoverMyMeds that prior authorization for FLUoxetine  HCl 10MG  tablets is due for renewal.   Insurance verification completed.   The patient is insured through [facility].  Action:  FLUOXETINE  HCL 10MG  CAPSULES is preferred by the insurance. TEST CLAIM:  NEXT AVAILABLE FILL DATE [id number] LAST FILL DT [id number]

## 2024-07-08 ENCOUNTER — Other Ambulatory Visit: Payer: Self-pay | Admitting: Physician Assistant

## 2024-07-08 DIAGNOSIS — F4001 Agoraphobia with panic disorder: Secondary | ICD-10-CM

## 2024-07-08 LAB — COMPREHENSIVE METABOLIC PANEL WITH GFR
ALT: 24 U/L (ref 0–35)
AST: 19 U/L (ref 0–37)
Albumin: 4.7 g/dL (ref 3.5–5.2)
Alkaline Phosphatase: 51 U/L (ref 39–117)
BUN: 12 mg/dL (ref 6–23)
CO2: 20 meq/L (ref 19–32)
Calcium: 9.3 mg/dL (ref 8.4–10.5)
Chloride: 107 meq/L (ref 96–112)
Creatinine, Ser: 0.81 mg/dL (ref 0.40–1.20)
GFR: 98.67 mL/min (ref >60.00)
Glucose, Bld: 90 mg/dL (ref 70–99)
Potassium: 3.8 meq/L (ref 3.5–5.1)
Sodium: 136 meq/L (ref 135–145)
Total Bilirubin: 0.5 mg/dL (ref 0.2–1.2)
Total Protein: 7.9 g/dL (ref 6.0–8.3)

## 2024-07-08 LAB — CBC WITH DIFFERENTIAL/PLATELET
Basophils Absolute: 0.1 K/uL (ref 0.0–0.1)
Basophils Relative: 0.8 % (ref 0.0–3.0)
Eosinophils Absolute: 0.1 K/uL (ref 0.0–0.7)
Eosinophils Relative: 1.3 % (ref 0.0–5.0)
HCT: 39.4 % (ref 36.0–46.0)
Hemoglobin: 13.4 g/dL (ref 12.0–15.0)
Lymphocytes Relative: 25.2 % (ref 12.0–46.0)
Lymphs Abs: 2.1 K/uL (ref 0.7–4.0)
MCHC: 34.1 g/dL (ref 30.0–36.0)
MCV: 88.1 fl (ref 78.0–100.0)
Monocytes Absolute: 0.4 K/uL (ref 0.1–1.0)
Monocytes Relative: 5 % (ref 3.0–12.0)
Neutro Abs: 5.7 K/uL (ref 1.4–7.7)
Neutrophils Relative %: 67.7 % (ref 43.0–77.0)
Platelets: 276 K/uL (ref 150.0–400.0)
RBC: 4.47 Mil/uL (ref 3.87–5.11)
RDW: 13.9 % (ref 11.5–15.5)
WBC: 8.5 K/uL (ref 4.0–10.5)

## 2024-07-08 LAB — MICROALBUMIN / CREATININE URINE RATIO
Creatinine,U: 376.2 mg/dL
Microalb Creat Ratio: 5.5 mg/g (ref 0.0–30.0)
Microalb, Ur: 2.1 mg/dL — ABNORMAL HIGH (ref 0.0–1.9)

## 2024-07-08 LAB — URINALYSIS, MICROSCOPIC ONLY: RBC / HPF: NONE SEEN (ref 0–?)

## 2024-07-08 LAB — C-REACTIVE PROTEIN: CRP: <1 mg/dL (ref 0.5–20.0)

## 2024-07-08 LAB — HCG, QUANTITATIVE, PREGNANCY: Quantitative HCG: <0.6 m[IU]/mL

## 2024-07-08 LAB — LIPASE: Lipase: 23 U/L (ref 11.0–59.0)

## 2024-07-08 LAB — SEDIMENTATION RATE: Sed Rate: 16 mm/h (ref 0–20)

## 2024-07-08 MED ORDER — CLONAZEPAM 0.5 MG PO TABS: 0.5000 mg | ORAL_TABLET | Freq: Two times a day (BID) | ORAL | 0 refills | Status: DC | PRN
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
## 2024-07-10 ENCOUNTER — Telehealth: Payer: Self-pay | Admitting: Physical Therapy

## 2024-07-10 ENCOUNTER — Ambulatory Visit: Payer: Self-pay | Admitting: *Deleted

## 2024-07-10 ENCOUNTER — Ambulatory Visit: Admitting: Physical Therapy

## 2024-07-10 ENCOUNTER — Encounter: Payer: Self-pay | Admitting: Physician Assistant

## 2024-07-10 LAB — ANTI-DNA ANTIBODY, DOUBLE-STRANDED: ds DNA Ab: 14 [IU]/mL — ABNORMAL HIGH

## 2024-07-10 LAB — C3 COMPLEMENT: C3 Complement: 175 mg/dL (ref 83–193)

## 2024-07-10 LAB — C4 COMPLEMENT: C4 Complement: 22 mg/dL (ref 15–57)

## 2024-07-10 NOTE — Telephone Encounter (Signed)
 Left patient a VM due to no show this morning for PT eval

## 2024-07-13 ENCOUNTER — Telehealth: Payer: Self-pay

## 2024-07-13 ENCOUNTER — Ambulatory Visit: Attending: Obstetrics and Gynecology | Admitting: Physical Therapy

## 2024-07-13 ENCOUNTER — Telehealth: Payer: Self-pay | Admitting: Physical Therapy

## 2024-07-13 NOTE — Telephone Encounter (Signed)
 Pharmacy Patient Advocate Encounter   Received notification from CoverMyMeds that prior authorization for Wegovy  0.5MG /0.5ML auto-injectors is required/requested.   Insurance verification completed.   The patient is insured through [facility] .   Per test claim: PA required; PA submitted to above mentioned insurance via CoverMyMeds Key/confirmation #/EOC [id number] Status is pending

## 2024-07-13 NOTE — Telephone Encounter (Signed)
 Left patient a VM re 3rd no show for PT eval, patient very apologetic, she reported that she worked till 4.a.m. Per dept policy, she was advised to get a new referral.
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
## 2024-07-16 NOTE — Telephone Encounter (Signed)
 Copied from CRM [id number]. Topic: Clinical - Medication Prior Auth >> Jul 16, 2024  2:41 PM Chiquita SQUIBB wrote: Reason for CRM: Kesha from [facility] rx is calling in with further questions regarding the Wegovy  medication. She states this is urgent as they have a 23 hour turn around time. The contact information is [phone] Option 2, Option 1, Case number [id number]

## 2024-07-16 NOTE — Telephone Encounter (Signed)
 Insurance faxed over for additional information for the prior authorization.   Faxed to [phone]

## 2024-07-17 ENCOUNTER — Other Ambulatory Visit (HOSPITAL_COMMUNITY): Payer: Self-pay

## 2024-07-20 ENCOUNTER — Other Ambulatory Visit (HOSPITAL_COMMUNITY): Payer: Self-pay

## 2024-07-20 NOTE — Telephone Encounter (Signed)
 Pharmacy Patient Advocate Encounter  Received notification from [facility] that Prior Authorization for Wegovy  0.5mg /0.20ml has been DENIED.  See denial reason below. No denial letter attached in CMM. Will attach denial letter to Media tab once received.   PA #/Case ID/Reference #: [id number]

## 2024-07-22 ENCOUNTER — Telehealth: Payer: Self-pay | Admitting: Pharmacist

## 2024-07-22 NOTE — Telephone Encounter (Signed)
 The insurance denied this PA renewal due to the absence of a recent office visit specifically addressing the patient's obesity. They require a documented weight within 45 days of submission, along with evidence that the patient is actively continuing the recommended lifestyle and dietary modifications. The insurer is seeking confirmation that the patient's progress is being monitored.  We can include the chart notes from the 07/07/2024 office visit, which do document a current weight; however, there is no discussion in that note regarding the patient's progress with Wegovy  therapy. For a successful PA or appeal, we need documentation that reflects both the patient's progress with Wegovy  and their continued adherence to lifestyle and dietary modifications.  Please advise.  Thank you, Devere Pandy, PharmD Clinical Pharmacist  [facility]  Direct Dial: [phone]

## 2024-07-22 NOTE — Telephone Encounter (Signed)
 LMOM for pt to return call to schedule follow up appointment for weight.

## 2024-08-04 ENCOUNTER — Encounter: Payer: Self-pay | Admitting: Physician Assistant

## 2024-08-04 ENCOUNTER — Telehealth: Admitting: Physician Assistant

## 2024-08-04 VITALS — Wt 214.0 lb

## 2024-08-04 DIAGNOSIS — T2122XA Burn of second degree of abdominal wall, initial encounter: Secondary | ICD-10-CM

## 2024-08-04 DIAGNOSIS — E669 Obesity, unspecified: Secondary | ICD-10-CM

## 2024-08-04 DIAGNOSIS — Z6839 Body mass index (BMI) 39.0-39.9, adult: Secondary | ICD-10-CM | POA: Diagnosis not present

## 2024-08-04 MED ORDER — WEGOVY 1 MG/0.5ML ~~LOC~~ SOAJ: 1.0000 mg | SUBCUTANEOUS | 3 refills | Status: DC

## 2024-08-04 MED ORDER — WEGOVY 0.5 MG/0.5ML ~~LOC~~ SOAJ: 0.5000 mg | SUBCUTANEOUS | 0 refills | Status: AC

## 2024-08-04 MED ORDER — SILVER SULFADIAZINE 1 % EX CREA: 1.0000 | TOPICAL_CREAM | Freq: Every day | CUTANEOUS | 0 refills | Status: AC

## 2024-08-04 NOTE — Assessment & Plan Note (Signed)
 Wt Readings from Last 3 Encounters:  08/04/24 214 lb (97.1 kg)  07/07/24 207 lb 6.4 oz (94.1 kg)  06/25/24 223 lb 8.7 oz (101.4 kg)    Pt was losing weight on 0.25 mg and then 0.5 mg of wegovy . Down from 223 to 207 lbs. Now being off for around a month, gaining weight back.   She is adhering to diet/exercise changes in additional to weight medical management.
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
## 2024-08-22 ENCOUNTER — Ambulatory Visit (HOSPITAL_COMMUNITY): Admitting: Psychiatry

## 2024-08-24 ENCOUNTER — Other Ambulatory Visit (HOSPITAL_COMMUNITY): Payer: Self-pay

## 2024-08-24 ENCOUNTER — Telehealth: Payer: Self-pay | Admitting: *Deleted

## 2024-08-24 ENCOUNTER — Telehealth: Payer: Self-pay

## 2024-08-24 NOTE — Telephone Encounter (Signed)
 Copied from CRM #[id number]. Topic: Clinical - Prescription Issue >> Aug 21, 2024  4:46 PM Roselie BROCKS wrote: Reason for CRM: Pharmacy is stating the Semaglutide -Weight Management (WEGOVY ) 1 MG/0.5ML SOAJ Is needing a prior authorization,

## 2024-08-24 NOTE — Telephone Encounter (Signed)
 Pharmacy Patient Advocate Encounter  Received notification from [employer] that Prior Authorization for Wegovy  1MG /0.5ML auto-injectors  has been APPROVED from 08/24/24 to 08/24/25. Ran test claim, Copay is $4. This test claim was processed through [facility]- copay amounts may vary at other pharmacies due to pharmacy/plan contracts, or as the patient moves through the different stages of their insurance plan.   PA #/Case ID/Reference #: [id number]

## 2024-08-24 NOTE — Telephone Encounter (Signed)
 PA request has been Submitted. New Encounter has been or will be created for follow up. For additional info see Pharmacy Prior Auth telephone encounter from 08/24/24.

## 2024-08-24 NOTE — Telephone Encounter (Signed)
 Pharmacy Patient Advocate Encounter   Received notification from Pt Calls Messages that prior authorization for Wegovy  1MG /0.5ML auto-injectors  is required/requested.   Insurance verification completed.   The patient is insured through [facility] .   Per test claim: PA required; PA submitted to above mentioned insurance via Latent Key/confirmation #/EOC [id number] Status is pending

## 2024-09-18 ENCOUNTER — Other Ambulatory Visit: Payer: Self-pay | Admitting: Physician Assistant

## 2024-09-18 DIAGNOSIS — R41840 Attention and concentration deficit: Secondary | ICD-10-CM

## 2024-09-18 MED ORDER — AMPHETAMINE-DEXTROAMPHET ER 10 MG PO CP24: 10.0000 mg | ORAL_CAPSULE | Freq: Every day | ORAL | 0 refills | Status: DC

## 2024-09-18 NOTE — Telephone Encounter (Unsigned)
 Copied from CRM [id number]. Topic: Clinical - Medication Refill >> Sep 18, 2024  1:03 PM Turkey A wrote: Medication: amphetamine -dextroamphetamine (ADDERALL XR) 10 MG 24 hr capsule Medication  Has the patient contacted their pharmacy? Yes (Agent: If no, request that the patient contact the pharmacy for the refill. If patient does not wish to contact the pharmacy document the reason why and proceed with request.) (Agent: If yes, when and what did the pharmacy advise?) Contact office  This is the patient's preferred pharmacy:  [facility] - [place], [place] - [address] [place] [address] Phone: [phone] Fax: [phone]  Is this the correct pharmacy for this prescription? Yes If no, delete pharmacy and type the correct one.   Has the prescription been filled recently? No  Is the patient out of the medication? Yes  Has the patient been seen for an appointment in the last year OR does the patient have an upcoming appointment? Yes  Can we respond through MyChart? Yes  Agent: Please be advised that Rx refills may take up to 3 business days. We ask that you follow-up with your pharmacy.

## 2024-09-24 ENCOUNTER — Other Ambulatory Visit: Payer: Self-pay | Admitting: Physician Assistant

## 2024-09-24 DIAGNOSIS — E669 Obesity, unspecified: Secondary | ICD-10-CM

## 2024-09-24 NOTE — Telephone Encounter (Unsigned)
 Copied from CRM [id number]. Topic: Clinical - Medication Refill >> Sep 24, 2024  4:14 PM Timindy P wrote: Medication: Semaglutide -Weight Management (WEGOVY ) 1 MG/0.5ML SOAJ  Has the patient contacted their pharmacy? No (Agent: If no, request that the patient contact the pharmacy for the refill. If patient does not wish to contact the pharmacy document the reason why and proceed with request.) (Agent: If yes, when and what did the pharmacy advise?)  This is the patient's preferred pharmacy:  [facility] - [place], [place] - [address] [place] [address] Phone: [phone] Fax: [phone]  Is this the correct pharmacy for this prescription? Yes If no, delete pharmacy and type the correct one.   Has the prescription been filled recently? No  Is the patient out of the medication? Yes  Has the patient been seen for an appointment in the last year OR does the patient have an upcoming appointment? Yes  Can we respond through MyChart? Yes  Agent: Please be advised that Rx refills may take up to 3 business days. We ask that you follow-up with your pharmacy.

## 2024-09-25 MED ORDER — WEGOVY 1 MG/0.5ML ~~LOC~~ SOAJ: 1.0000 mg | SUBCUTANEOUS | 3 refills | Status: DC

## 2024-11-18 ENCOUNTER — Telehealth: Payer: Self-pay | Admitting: Neurology

## 2024-11-18 ENCOUNTER — Other Ambulatory Visit: Payer: Self-pay | Admitting: Family Medicine

## 2024-11-18 ENCOUNTER — Telehealth: Payer: Self-pay

## 2024-11-18 DIAGNOSIS — R41840 Attention and concentration deficit: Secondary | ICD-10-CM

## 2024-11-18 DIAGNOSIS — E669 Obesity, unspecified: Secondary | ICD-10-CM

## 2024-11-18 NOTE — Telephone Encounter (Signed)
 Copied from CRM #[id number]. Topic: Appointments - Transfer of Care >> Nov 18, 2024  4:30 PM Rea C wrote: Pt is requesting to transfer FROM: Cyndi Shaver Pt is requesting to transfer TO: Almarie Birmingham Reason for requested transfer: [facility] It is the responsibility of the team the patient would like to transfer to (NP Almarie ) to reach out to the patient if for any reason this transfer is not acceptable.

## 2024-11-18 NOTE — Telephone Encounter (Signed)
 Copied from CRM #[id number]. Topic: Clinical - Medication Prior Auth >> Nov 18, 2024  4:31 PM Rea C wrote: Reason for CRM: Patient is existing patient of PA-C Manuelita Flatness. Set up TOC in March with Waddell Mon, NP.  Patient called in and stated that med prior auth is needed for semaglutide -weight management (WEGOVY ) 1 MG/0.5ML SOAJ SQ injection after she spoke with pharmacy.   [phone] BENNIE)

## 2024-11-18 NOTE — Telephone Encounter (Signed)
 Copied from CRM #[id number]. Topic: Appointments - Transfer of Care >> Nov 18, 2024  4:30 PM Rea C wrote: Pt is requesting to transfer FROM: Shannon Galloway Pt is requesting to transfer TO: Almarie Birmingham Reason for requested transfer: [facility] It is the responsibility of the team the patient would like to transfer to (NP Almarie ) to reach out to the patient if for any reason this transfer is not acceptable.

## 2024-11-18 NOTE — Telephone Encounter (Unsigned)
 Copied from CRM #[id number]. Topic: Clinical - Medication Refill >> Nov 18, 2024  4:28 PM [name] C wrote: Medication: amphetamine -dextroamphetamine (ADDERALL XR) 10 MG 24 hr capsule, semaglutide -weight management (WEGOVY ) 1 MG/0.5ML SOAJ SQ injection. Prior Authorization   Has the patient contacted their pharmacy? Yes and they told patient to contact clinic. Patient was existing patient of PA Manuelita Flatness but is set up with TOC in March with Waddell Mon, NP.   This is the patient's preferred pharmacy:  [facility] - [place], [place] - [address] [place] [address] Phone: [phone] Fax: [phone]  Is this the correct pharmacy for this prescription? Yes If no, delete pharmacy and type the correct one.   Has the prescription been filled recently? Yes  Is the patient out of the medication? Yes  Has the patient been seen for an appointment in the last year OR does the patient have an upcoming appointment? Yes  Can we respond through MyChart? Yes  Agent: Please be advised that Rx refills may take up to 3 business days. We ask that you follow-up with your pharmacy.

## 2024-11-19 ENCOUNTER — Other Ambulatory Visit (HOSPITAL_COMMUNITY): Payer: Self-pay

## 2024-11-19 ENCOUNTER — Telehealth: Payer: Self-pay

## 2024-11-19 MED ORDER — WEGOVY 1 MG/0.5ML ~~LOC~~ SOAJ: 1.0000 mg | SUBCUTANEOUS | 3 refills | Status: AC

## 2024-11-19 MED ORDER — AMPHETAMINE-DEXTROAMPHET ER 10 MG PO CP24: 10.0000 mg | ORAL_CAPSULE | Freq: Every day | ORAL | 0 refills | Status: AC

## 2024-11-19 NOTE — Telephone Encounter (Signed)
 Noted

## 2024-11-19 NOTE — Telephone Encounter (Signed)

## 2024-11-23 ENCOUNTER — Telehealth: Payer: Self-pay | Admitting: Neurology

## 2024-11-23 NOTE — Telephone Encounter (Signed)
 Needs sooner visit, any provider for medication discussion.

## 2024-11-23 NOTE — Telephone Encounter (Signed)
 Copied from CRM [id number]. Topic: Clinical - Medication Prior Auth >> Nov 23, 2024  3:34 PM Shannon Galloway wrote: Reason for CRM: Pt calling about requesting Wegovy  prior auth with new TOC provider Shannon Galloway. I let her know the response for previous prior auth request. She says she she did have a heart attack at age 28 and knows she has OSA, so might start working on getting dx with that in case it is needed for coverage. Per message from natalie, Wegovy  can be covered for weight loss is there is history of heart attack. (Looking at med recs from 2022 visit with [facility], she saw cardiology who denied the history of a heart attack). Should she get referral to sleep med?

## 2024-11-24 NOTE — Telephone Encounter (Signed)
 Got her scheduled

## 2024-12-02 ENCOUNTER — Ambulatory Visit: Admitting: Family Medicine

## 2024-12-02 NOTE — Progress Notes (Incomplete)
 Established Patient Office Visit  Subjective:  Patient ID: Shannon Galloway, female    DOB: February 11, 1996  Age: 28 y.o. MRN: [id number]  CC: No chief complaint on file.     HPI Shannon Galloway is here for medication discussion.       Past Medical History:  Diagnosis Date   Chronic headaches    Lupus    Neck pain    POTS (postural orthostatic tachycardia syndrome)    Syncope    a. unclear etilogy. 10/2016: cardiac MRI and echo unremarkable.     Past Surgical History:  Procedure Laterality Date   EPIDURAL BLOOD PATCH  10/2023   KNEE SURGERY Right 12/31/2009   TONSILLECTOMY Bilateral     Family History  Adopted: Yes  Problem Relation Age of Onset   Heart failure Father        Died at 31   Bipolar disorder Mother    Pneumonia Mother        Died at 2    Social History   Socioeconomic History   Marital status: Single    Spouse name: Not on file   Number of children: Not on file   Years of education: Not on file   Highest education level: Not on file  Occupational History   Not on file  Tobacco Use   Smoking status: Former    Current packs/day: 0.00    Average packs/day: 0.3 packs/day for 4.0 years (1.0 ttl pk-yrs)    Types: Cigarettes, E-cigarettes    Start date: 2018    Quit date: 2022    Years since quitting: 3.9   Smokeless tobacco: Never  Vaping Use   Vaping status: Every Day   Substances: Nicotine   Substance and Sexual Activity   Alcohol use: Not Currently    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 See psychiatry note from 02/11/2024   Sexual activity: Not Currently    Birth control/protection: None, Abstinence  Other Topics Concern   Not on file  Social History Narrative   Not on file   Social Drivers of Health   Financial Resource Strain: High Risk (08/24/2024)   Received  from [facility]   Overall Financial Resource Strain (CARDIA)    Difficulty of Paying Living Expenses: Very hard  Food Insecurity: No Food Insecurity (09/18/2023)   Received from [facility]   Hunger Vital Sign    Within the past 12 months, you worried that your food would run out before you got the money to buy more.: Never true    Within the past 12 months, the food you bought just didn't last and you didn't have money to get more.: Never true  Transportation Needs: No Transportation Needs (08/24/2024)   Received from [facility] - Transportation    In the past 12 months, has lack of transportation kept you from medical appointments or from getting medications?: No    Lack of Transportation (Non-Medical): No  Physical Activity: Inactive (08/24/2024)   Received from [facility]   Exercise Vital Sign    On average, how many days per week do you engage in moderate to strenuous exercise (like a brisk walk)?: 0 days    On average, how many minutes do you engage in exercise at this level?: 0  min  Stress: Stress Concern Present (08/24/2024)   Received from [facility] - Occupational Stress Questionnaire    Feeling of Stress : Very much  Social Connections: Moderately Isolated (08/24/2024)   Received from [facility]   Social Connection and Isolation Panel    In a typical week, how many times do you talk on the phone with family, friends, or neighbors?: More than three times a week    How often do you get together with friends or relatives?: More than three times a week    How often do you attend church or religious services?: Never    Do you belong to any clubs or organizations such as church groups, unions, fraternal or athletic groups, or school groups?: No    How often do you attend meetings of the clubs or organizations you belong to?:  More than 4 times per year    Are you married, widowed, divorced, separated, never married, or living with a partner?: Never married  Intimate Partner Violence: Not on file    ROS All ROS negative except what is listed in the HPI.   Objective:   Today's Vitals: There were no vitals taken for this visit.  Physical Exam  Assessment & Plan:   Problem List Items Addressed This Visit   None     Follow-up: No follow-ups on file.   Waddell FURY Almarie, DNP, FNP-C  I,Emily Lagle,acting as a neurosurgeon for Waddell KATHEE Almarie, NP.,have documented all relevant documentation on the behalf of Waddell KATHEE Almarie, NP.  I, Waddell KATHEE Almarie, NP, have reviewed all documentation for this visit. The documentation on 12/02/2024 for the exam, diagnosis, procedures, and orders are all accurate and complete.

## 2025-01-21 ENCOUNTER — Other Ambulatory Visit: Payer: Self-pay

## 2025-01-21 ENCOUNTER — Ambulatory Visit: Payer: Self-pay

## 2025-01-21 DIAGNOSIS — F4001 Agoraphobia with panic disorder: Secondary | ICD-10-CM

## 2025-01-21 MED ORDER — CLONAZEPAM 0.5 MG PO TABS: 0.5000 mg | ORAL_TABLET | Freq: Two times a day (BID) | ORAL | 0 refills | Status: AC | PRN

## 2025-01-21 NOTE — Telephone Encounter (Signed)
 FYI Only or Action Required?: FYI only for provider: ED advised.  Patient was last seen in primary care on 08/04/2024 by Cyndi Shaver, PA-C.  Called Nurse Triage reporting Panic Attack.  Symptoms began today.  Interventions attempted: Nothing.  Symptoms are: unchanged.  Triage Disposition: Go to ED Now (or PCP Triage)  Patient/caregiver understands and will follow disposition?: Yes, will follow disposition  Reason for Triage: panic episode today and have anxiety bad, under a lot of stress.    Reason for Disposition  [1] SEVERE anxiety (e.g., extremely anxious with intense emotional symptoms such as feeling of unreality, urge to flee, unable to calm down; unable to cope or function) AND [2] not better after 10 minutes of reassurance and Care Advice  Answer Assessment - Initial Assessment Questions 1. CONCERN: Did anything happen that prompted you to call today?      Going through uncles murder trial, and the cremation people gave his ashes to someone else and they have been spread already? 2. ANXIETY SYMPTOMS: Can you describe how you (your loved one; patient) have been feeling? (e.g., tense, restless, panicky, anxious, keyed up, overwhelmed, sense of impending doom).      Panic, anxious 3. ONSET: How long have you been feeling this way? (e.g., hours, days, weeks)     today 4. SEVERITY: How would you rate the level of anxiety? (e.g., 0 - 10; or mild, moderate, severe).     severe 7. RISK OF HARM - SUICIDAL IDEATION: Do you ever have thoughts of hurting or killing yourself? If Yes, ask:  Do you have these feelings now? Do you have a plan on how you would do this?     denies  Protocols used: Anxiety and Panic Attack-A-AH

## 2025-01-21 NOTE — Telephone Encounter (Signed)
 FYI. Pt going to ED. She establishes care w/ you in March

## 2025-01-21 NOTE — Telephone Encounter (Signed)
 Copied from CRM [id number]. Topic: Clinical - Medication Refill >> Jan 21, 2025 12:43 PM Alfonso HERO wrote: Medication: clonazePAM  (KLONOPIN ) 0.5 MG tablet  Has the patient contacted their pharmacy? No (Agent: If no, request that the patient contact the pharmacy for the refill. If patient does not wish to contact the pharmacy document the reason why and proceed with request.) (Agent: If yes, when and what did the pharmacy advise?)  This is the patient's preferred pharmacy:  [facility] - [place], [place] - [address] [place] [address] Phone: [phone] Fax: [phone]  Is this the correct pharmacy for this prescription? Yes If no, delete pharmacy and type the correct one.   Has the prescription been filled recently? Yes  Is the patient out of the medication? Yes  Has the patient been seen for an appointment in the last year OR does the patient have an upcoming appointment? Yes  Can we respond through MyChart? Yes  Agent: Please be advised that Rx refills may take up to 3 business days. We ask that you follow-up with your pharmacy.

## 2025-03-03 ENCOUNTER — Encounter: Admitting: Family Medicine
